# Patient Record
Sex: Female | Born: 1945
Health system: Southern US, Community
[De-identification: ages and names within clinical notes are randomized; demographics above are authoritative.]

## PROBLEM LIST (undated history)

## (undated) DIAGNOSIS — E785 Hyperlipidemia, unspecified: Secondary | ICD-10-CM

## (undated) DIAGNOSIS — K219 Gastro-esophageal reflux disease without esophagitis: Secondary | ICD-10-CM

## (undated) DIAGNOSIS — N189 Chronic kidney disease, unspecified: Secondary | ICD-10-CM

## (undated) DIAGNOSIS — I10 Essential (primary) hypertension: Secondary | ICD-10-CM

## (undated) DIAGNOSIS — M1990 Unspecified osteoarthritis, unspecified site: Secondary | ICD-10-CM

## (undated) DIAGNOSIS — H269 Unspecified cataract: Secondary | ICD-10-CM

## (undated) DIAGNOSIS — Z5189 Encounter for other specified aftercare: Secondary | ICD-10-CM

## (undated) DIAGNOSIS — E119 Type 2 diabetes mellitus without complications: Secondary | ICD-10-CM

## (undated) HISTORY — DX: Gastro-esophageal reflux disease without esophagitis: K21.9

## (undated) HISTORY — PX: COLONOSCOPY: SHX174

## (undated) HISTORY — PX: APPENDECTOMY: SHX54

## (undated) HISTORY — DX: Type 2 diabetes mellitus without complications: E11.9

## (undated) HISTORY — DX: Unspecified cataract: H26.9

## (undated) HISTORY — PX: EYE SURGERY: SHX253

## (undated) HISTORY — PX: SPINE SURGERY: SHX786

## (undated) HISTORY — DX: Chronic kidney disease, unspecified: N18.9

## (undated) HISTORY — DX: Encounter for other specified aftercare: Z51.89

## (undated) HISTORY — DX: Hyperlipidemia, unspecified: E78.5

## (undated) HISTORY — PX: TONSILLECTOMY: SUR1361

---

## 1972-10-04 HISTORY — PX: ABDOMINAL HYSTERECTOMY: SHX81

## 2011-11-22 DIAGNOSIS — H01009 Unspecified blepharitis unspecified eye, unspecified eyelid: Secondary | ICD-10-CM | POA: Diagnosis not present

## 2012-07-20 ENCOUNTER — Ambulatory Visit (INDEPENDENT_AMBULATORY_CARE_PROVIDER_SITE_OTHER): Payer: Managed Care, Other (non HMO) | Admitting: Family Medicine

## 2012-07-20 VITALS — BP 179/111 | HR 90 | Temp 98.0°F | Resp 16 | Ht 63.0 in | Wt 207.2 lb

## 2012-07-20 DIAGNOSIS — R21 Rash and other nonspecific skin eruption: Secondary | ICD-10-CM

## 2012-07-20 DIAGNOSIS — L299 Pruritus, unspecified: Secondary | ICD-10-CM

## 2012-07-20 MED ORDER — METHYLPREDNISOLONE 4 MG PO KIT: PACK | ORAL | Status: DC

## 2012-07-20 MED ORDER — METHYLPREDNISOLONE SODIUM SUCC 125 MG IJ SOLR
125.0000 mg | Freq: Once | INTRAMUSCULAR | Status: AC
  Administered 2012-07-20: 125 mg via INTRAMUSCULAR

## 2012-07-20 NOTE — Patient Instructions (Signed)
Rash  A rash is a change in the color or texture of your skin. There are many different types of rashes. You may have other problems that accompany your rash.  CAUSES     Infections.   Allergic reactions. This can include allergies to pets or foods.   Certain medicines.   Exposure to certain chemicals, soaps, or cosmetics.   Heat.   Exposure to poisonous plants.   Tumors, both cancerous and noncancerous.  SYMPTOMS     Redness.   Scaly skin.   Itchy skin.   Dry or cracked skin.   Bumps.   Blisters.   Pain.  DIAGNOSIS   Your caregiver may do a physical exam to determine what type of rash you have. A skin sample (biopsy) may be taken and examined under a microscope.  TREATMENT    Treatment depends on the type of rash you have. Your caregiver may prescribe certain medicines. For serious conditions, you may need to see a skin doctor (dermatologist).  HOME CARE INSTRUCTIONS     Avoid the substance that caused your rash.   Do not scratch your rash. This can cause infection.   You may take cool baths to help stop itching.   Only take over-the-counter or prescription medicines as directed by your caregiver.   Keep all follow-up appointments as directed by your caregiver.  SEEK IMMEDIATE MEDICAL CARE IF:   You have increasing pain, swelling, or redness.   You have a fever.   You have new or severe symptoms.   You have body aches, diarrhea, or vomiting.   Your rash is not better after 3 days.  MAKE SURE YOU:   Understand these instructions.   Will watch your condition.   Will get help right away if you are not doing well or get worse.  Document Released: 09/10/2002 Document Revised: 12/13/2011 Document Reviewed: 07/05/2011  [employer] Patient Information 2013 [employer].

## 2012-07-20 NOTE — Progress Notes (Signed)
[facility]:  Office Visit  Chief Complaint:  Chief Complaint  Patient presents with  . Rash    around neck right inner thigh under right breast started last night    HPI: Erica Thomas is a 66 y.o. female who complains of  Rash on bilateral part of body from head, neck, and breast, was traveling and in different hotels. Itching and pain but numbness or tingling. More itching. No new meds, no new soaps, no new detergents. She is moving from [place] to [place]. She has been moving boxes in the sheds. Moved from [place] to [place].  Past Medical History  Diagnosis Date  . Hyperlipidemia   . Cataract   . Diabetes mellitus without complication    Past Surgical History  Procedure Date  . Appendectomy   . Abdominal hysterectomy    History   Social History  . Marital Status: Divorced    Spouse Name: N/A    Number of Children: N/A  . Years of Education: N/A   Social History Main Topics  . Smoking status: Never Smoker   . Smokeless tobacco: None  . Alcohol Use: No  . Drug Use: No  . Sexually Active: None   Other Topics Concern  . None   Social History Narrative  . None   Family History  Problem Relation Age of Onset  . Stroke Mother   . Heart disease Father   . Cancer Sister    Allergies  Allergen Reactions  . Penicillins    Prior to Admission medications   Not on File     ROS: The patient denies fevers, chills, night sweats, unintentional weight loss, chest pain, palpitations, wheezing, dyspnea on exertion, nausea, vomiting, abdominal pain, dysuria, hematuria, melena, numbness, weakness, or tingling.   All other systems have been reviewed and were otherwise negative with the exception of those mentioned in the HPI and as above.    PHYSICAL EXAM: Filed Vitals:   07/20/12 0922  BP: 179/111  Pulse: 90  Temp: 98 F (36.7 C)  Resp: 16   Filed Vitals:   07/20/12 0922  Height: 5\' 3"  (1.6 m)  Weight: 207 lb 3.2 oz (93.985 kg)    Body mass index is 36.70 kg/(m^2).  General: Alert, no acute distress HEENT:  Normocephalic, atraumatic, oropharynx patent.  Cardiovascular:  Regular rate and rhythm, no rubs murmurs or gallops.  No Carotid bruits, radial pulse intact. No pedal edema.  Respiratory: Clear to auscultation bilaterally.  No wheezes, rales, or rhonchi.  No cyanosis, no use of accessory musculature GI: No organomegaly, abdomen is soft and non-tender, positive bowel sounds.  No masses. Skin: [place] erythematous lesions Neurologic: Facial musculature symmetric. Psychiatric: Patient is appropriate throughout our interaction. Lymphatic: No cervical lymphadenopathy Musculoskeletal: Gait intact.   LABS: No results found for this or any previous visit.   EKG/XRAY:   Primary read interpreted by Dr. Conley Rolls at [facility].   ASSESSMENT/PLAN: Encounter Diagnoses  Name Primary?  . Rash Yes  . Pruritus    Most likely related to spider/insect bite and this appears to be less likely shingles since it is on both sides of her body and mostly on head and neck.  Solumedrol IM x 1, Medrol dose pack, Take Benadryl and Zyrtec  I don't think she needs abx at this time since it looks like an inflammatory allergic dermatitis picture but she may need it if it becomes worse.     LE, THAO PHUONG, DO 07/20/2012 9:41 AM

## 2012-09-21 ENCOUNTER — Encounter: Payer: Self-pay | Admitting: Family Medicine

## 2012-09-21 ENCOUNTER — Ambulatory Visit (INDEPENDENT_AMBULATORY_CARE_PROVIDER_SITE_OTHER): Payer: Managed Care, Other (non HMO) | Admitting: Family Medicine

## 2012-09-21 VITALS — BP 192/110 | HR 94 | Temp 98.7°F | Resp 16 | Ht 63.0 in | Wt 207.4 lb

## 2012-09-21 DIAGNOSIS — M79609 Pain in unspecified limb: Secondary | ICD-10-CM

## 2012-09-21 DIAGNOSIS — E119 Type 2 diabetes mellitus without complications: Secondary | ICD-10-CM | POA: Diagnosis not present

## 2012-09-21 DIAGNOSIS — E785 Hyperlipidemia, unspecified: Secondary | ICD-10-CM | POA: Diagnosis not present

## 2012-09-21 DIAGNOSIS — Z23 Encounter for immunization: Secondary | ICD-10-CM

## 2012-09-21 DIAGNOSIS — I10 Essential (primary) hypertension: Secondary | ICD-10-CM | POA: Insufficient documentation

## 2012-09-21 LAB — COMPREHENSIVE METABOLIC PANEL
AST: 22 U/L (ref 0–37)
Albumin: 4.4 g/dL (ref 3.5–5.2)
Alkaline Phosphatase: 115 U/L (ref 39–117)
Calcium: 9.5 mg/dL (ref 8.4–10.5)
Chloride: 102 mEq/L (ref 96–112)
Potassium: 4 mEq/L (ref 3.5–5.3)
Sodium: 140 mEq/L (ref 135–145)
Total Protein: 6.7 g/dL (ref 6.0–8.3)

## 2012-09-21 LAB — LIPID PANEL: LDL Cholesterol: 218 mg/dL — ABNORMAL HIGH (ref 0–99)

## 2012-09-21 MED ORDER — ATORVASTATIN CALCIUM 40 MG PO TABS: 40.0000 mg | ORAL_TABLET | Freq: Every day | ORAL | Status: DC

## 2012-09-21 MED ORDER — METFORMIN HCL 500 MG PO TABS: 500.0000 mg | ORAL_TABLET | Freq: Two times a day (BID) | ORAL | Status: DC

## 2012-09-21 MED ORDER — TRAMADOL HCL 50 MG PO TABS: 50.0000 mg | ORAL_TABLET | Freq: Three times a day (TID) | ORAL | Status: DC | PRN

## 2012-09-21 MED ORDER — LISINOPRIL-HYDROCHLOROTHIAZIDE 20-25 MG PO TABS: 1.0000 | ORAL_TABLET | Freq: Every day | ORAL | Status: DC

## 2012-09-21 MED ORDER — SITAGLIPTIN PHOS-METFORMIN HCL 50-500 MG PO TABS: 1.0000 | ORAL_TABLET | Freq: Two times a day (BID) | ORAL | Status: DC

## 2012-09-21 NOTE — Progress Notes (Signed)
S:  This 66 y.o. Cauc female moved to [place] from [place] when her job transferred her. She has HTN-Hyperlipidemia-Obesity syndrome but has been off medications for a year. She was being treated for Diabetes; last A1c= 6.4%. The pt was taking Janumet because she could not tolerate Metformin (diarrhea). Without medications, she has tried to manage nutrition by but has a weakness for bread. She is unable to exercise because of chronic left thigh pain.  Pt reports left hip and knee pain for 1 year. She fell and has had pain since that time; hip and femur xrays post-injury were negative. She has pain in left anterior thigh when she flexs her hip and when she lays on left hip at night. She can walk short distances but pain develops after prolonged ambulation.  Aleve relieves pain.  ROS: As per HPI; negative for  Unexplained weight gain or loss, fever/ chills, temperature intolerance, CP or tightness, SOB or DOE, palpitations, edema, excessive thirst or polyuria, HA, dizziness, weakness, numbness or syncope.  O:  Filed Vitals:   09/21/12 1611 09/21/12 1654  BP: 184/108 192/110  Pulse: 94   Temp: 98.7 F (37.1 C)   TempSrc: Oral   Resp: 16   Height: 5\' 3"  (1.6 m)   Weight: 207 lb 6.4 oz (94.076 kg)   SpO2: 97%    GEN: In NAD; WN,WD. HEENT: /AT; EOMI w/ clear conj/ sclerae. EACs/TMs normal. Oroph moist and clear. NECK: Suppple w/o LAN or TM. COR: RRR. LUNGS: Normal resp rate and effort. MS: Spine- straight and NT. SI joints- NT. R hip- FROM. L hip- limited abduction; quadriceps muscle tender to palpation. NEURO: A&O x 3; CNs intact. Motor- L hip flexor 3+/5; L knee extension against resistance- 3+/5. R lower ext- 4+/5.                DTRs- 2+/= in UEs nad LEs. Sensory intact.   Results for orders placed in visit on 09/21/12  POCT GLYCOSYLATED HEMOGLOBIN (HGB A1C)      Component Value Range   Hemoglobin A1C 6.2      A/P: 1. Type II or unspecified type diabetes mellitus without  mention of complication, uncontrolled  Will not resume Medication (pt has side effect w/ Metformin) Pt to manage nutrition better and try to be more active.   2. Hyperlipidemia  RX: Atorvastatin 40 mg 1 tablet at bedtime Get information from [facility] web-site re: Lipid Diet Comprehensive metabolic panel, Lipid panel  3. Limb pain  Vitamin D, 25-hydroxy MR Femur Left Wo Contrast RX: Tramadol 50 mg 1 tab at bedtime; continue Aleve 1 tab bid prn pain.  4. Need for prophylactic vaccination and inoculation against influenza  Flu vaccine greater than or equal to 3yo preservative free IM   5.  HTN                                                                     RX: Lisinopril- HCTZ 20-25 mg 1 tablet daily  Get ECG at next visit.

## 2012-09-21 NOTE — Patient Instructions (Addendum)
You will work on nutrition and trying to be more active until we can figure out what is wrong with your thigh. [facility] has a Lipid Diet taht you may be able to find on the [facility] web-site. Also, look for information about Mediterannean Diet. BP medication- Lisinopril-HCTZ  20-25 mg  1 tablet daily to lower blood pressure. Tramadol is for pain; you can take 1 tablet every 8 hours or just take it at bedtime to help you rest better.  You will be called about appointment for MRI of your left femur (muscle).  You will be returning for recheck in 4 weeks.

## 2012-09-22 ENCOUNTER — Encounter: Payer: Self-pay | Admitting: Family Medicine

## 2012-09-22 ENCOUNTER — Other Ambulatory Visit: Payer: Self-pay | Admitting: Family Medicine

## 2012-09-22 LAB — VITAMIN D 25 HYDROXY (VIT D DEFICIENCY, FRACTURES): Vit D, 25-Hydroxy: 16 ng/mL — ABNORMAL LOW (ref 30–89)

## 2012-09-22 MED ORDER — ERGOCALCIFEROL 1.25 MG (50000 UT) PO CAPS: 50000.0000 [IU] | ORAL_CAPSULE | ORAL | Status: DC

## 2012-09-28 ENCOUNTER — Ambulatory Visit
Admission: RE | Admit: 2012-09-28 | Discharge: 2012-09-28 | Disposition: A | Discharge status: Home / Self Care | Payer: 59 | Source: Ambulatory Visit | Attending: Family Medicine | Admitting: Family Medicine

## 2012-09-28 DIAGNOSIS — M79609 Pain in unspecified limb: Secondary | ICD-10-CM

## 2012-09-28 NOTE — Progress Notes (Signed)
Quick Note:  Your recent MRI did not show any abnormality in the thigh bone or muscle. You have an appt on Oct 12, 2012 for follow-up; we can discuss the results in detail at that time. ______

## 2012-10-12 ENCOUNTER — Encounter: Payer: Self-pay | Admitting: Family Medicine

## 2012-10-12 ENCOUNTER — Ambulatory Visit (INDEPENDENT_AMBULATORY_CARE_PROVIDER_SITE_OTHER): Payer: Managed Care, Other (non HMO) | Admitting: Family Medicine

## 2012-10-12 VITALS — BP 158/90 | HR 80 | Temp 98.4°F | Resp 16 | Ht 61.5 in | Wt 202.0 lb

## 2012-10-12 DIAGNOSIS — M5430 Sciatica, unspecified side: Secondary | ICD-10-CM | POA: Diagnosis not present

## 2012-10-12 DIAGNOSIS — E119 Type 2 diabetes mellitus without complications: Secondary | ICD-10-CM

## 2012-10-12 DIAGNOSIS — M5432 Sciatica, left side: Secondary | ICD-10-CM

## 2012-10-12 DIAGNOSIS — I10 Essential (primary) hypertension: Secondary | ICD-10-CM

## 2012-10-12 MED ORDER — PREDNISONE 20 MG PO TABS: 20.0000 mg | ORAL_TABLET | Freq: Every day | ORAL | Status: DC

## 2012-10-12 NOTE — Patient Instructions (Addendum)
Health Maintenance Plans     COLONOSCOPY EVERY 10 YEARS    INFLUENZA VACCINE AGE 69 MONTHS AND UP .  Every year   MAMMOGRAM EVERY 2 YEARS    PNEUMOCOCCAL POLYSACCHARIDE VACCINE AGE 675 AND OVER    TETANUS/TDAP AGE 47 +  Every 10 years   ZOSTAVAX AGE 670 AND OVER.  One time Sciatica Sciatica is pain, weakness, numbness, or tingling along the path of the sciatic nerve. The nerve starts in the lower back and runs down the back of each leg. The nerve controls the muscles in the lower leg and in the back of the knee, while also providing sensation to the back of the thigh, lower leg, and the sole of your foot. Sciatica is a symptom of another medical condition. For instance, nerve damage or certain conditions, such as a herniated disk or bone spur on the spine, pinch or put pressure on the sciatic nerve. This causes the pain, weakness, or other sensations normally associated with sciatica. Generally, sciatica only affects one side of the body. CAUSES   Herniated or slipped disc.  Degenerative disk disease.  A pain disorder involving the narrow muscle in the buttocks (piriformis syndrome).  Pelvic injury or fracture.  Pregnancy.  Tumor (rare). SYMPTOMS  Symptoms can vary from mild to very severe. The symptoms usually travel from the low back to the buttocks and down the back of the leg. Symptoms can include:  Mild tingling or dull aches in the lower back, leg, or hip.  Numbness in the back of the calf or sole of the foot.  Burning sensations in the lower back, leg, or hip.  Sharp pains in the lower back, leg, or hip.  Leg weakness.  Severe back pain inhibiting movement. These symptoms may get worse with coughing, sneezing, laughing, or prolonged sitting or standing. Also, being overweight may worsen symptoms. DIAGNOSIS  Your caregiver will perform a physical exam to look for common symptoms of sciatica. He or she may ask you to do certain movements or activities that would trigger  sciatic nerve pain. Other tests may be performed to find the cause of the sciatica. These may include:  Blood tests.  X-rays.  Imaging tests, such as an MRI or CT scan. TREATMENT  Treatment is directed at the cause of the sciatic pain. Sometimes, treatment is not necessary and the pain and discomfort goes away on its own. If treatment is needed, your caregiver may suggest:  Over-the-counter medicines to relieve pain.  Prescription medicines, such as anti-inflammatory medicine, muscle relaxants, or narcotics.  Applying heat or ice to the painful area.  Steroid injections to lessen pain, irritation, and inflammation around the nerve.  Reducing activity during periods of pain.  Exercising and stretching to strengthen your abdomen and improve flexibility of your spine. Your caregiver may suggest losing weight if the extra weight makes the back pain worse.  Physical therapy.  Surgery to eliminate what is pressing or pinching the nerve, such as a bone spur or part of a herniated disk. HOME CARE INSTRUCTIONS   Only take over-the-counter or prescription medicines for pain or discomfort as directed by your caregiver.  Apply ice to the affected area for 20 minutes, 3 4 times a day for the first 48 72 hours. Then try heat in the same way.  Exercise, stretch, or perform your usual activities if these do not aggravate your pain.  Attend physical therapy sessions as directed by your caregiver.  Keep all follow-up appointments as directed  by your caregiver.  Do not wear high heels or shoes that do not provide proper support.  Check your mattress to see if it is too soft. A firm mattress may lessen your pain and discomfort. SEEK IMMEDIATE MEDICAL CARE IF:   You lose control of your bowel or bladder (incontinence).  You have increasing weakness in the lower back, pelvis, buttocks, or legs.  You have redness or swelling of your back.  You have a burning sensation when you  urinate.  You have pain that gets worse when you lie down or awakens you at night.  Your pain is worse than you have experienced in the past.  Your pain is lasting longer than 4 weeks.  You are suddenly losing weight without reason. MAKE SURE YOU:  Understand these instructions.  Will watch your condition.  Will get help right away if you are not doing well or get worse. Document Released: 09/14/2001 Document Revised: 03/21/2012 Document Reviewed: 01/30/2012 [facility] Patient Information 2013 [place], [place].

## 2012-10-12 NOTE — Progress Notes (Signed)
67 yo woman who has retired and has had left thigh and hip pain for over a year, partially relieved by aleve and celebrex in the past.  The pain is very noticeable when she swings her leg up into the bed.  She does much of her own cooking and would like to walk more, but the prolonged standing and walking of these activities is interfering. She recalls two falls in the last year that may have started the back and left leg discomfort. No GI or GU symptoms, no fever  Objective:  NAD, alert and very pleasant Straight leg raising is positive on the left Internal and external rotation of the hip is normal Palpation of the left trochanteric region shows some tenderness, but she is nontender in her lumbar spine Knee crossover does not suggest sacroiliac inflammation Good pedal pulses  Assessment: improved blood pressure, the back pain seems most consistent with a sciatica L5 type syndrome (especially since prednisone given last December had a noticeable improvement effect)  Plan: Follow up one month 1. Sciatica of left side  predniSONE (DELTASONE) 20 MG tablet  2. Type 2 diabetes mellitus    3. Hypertension    Note:  I did not to an EKG because patient has had an echo and thorough workup in the past We discussed diet and exercise as well.  I don't think walking is a good idea, but rather some more gentle exercise such as aquatic activity would be easier on the joints and back. We reviewed health maintenance issues this visit as well.

## 2012-11-09 ENCOUNTER — Encounter: Payer: Self-pay | Admitting: Family Medicine

## 2012-11-09 ENCOUNTER — Ambulatory Visit (INDEPENDENT_AMBULATORY_CARE_PROVIDER_SITE_OTHER): Payer: Managed Care, Other (non HMO) | Admitting: Family Medicine

## 2012-11-09 VITALS — BP 136/77 | HR 92 | Temp 98.9°F | Resp 16 | Ht 62.25 in | Wt 204.0 lb

## 2012-11-09 DIAGNOSIS — M5137 Other intervertebral disc degeneration, lumbosacral region: Secondary | ICD-10-CM

## 2012-11-09 DIAGNOSIS — M79609 Pain in unspecified limb: Secondary | ICD-10-CM | POA: Diagnosis not present

## 2012-11-09 DIAGNOSIS — M79606 Pain in leg, unspecified: Secondary | ICD-10-CM

## 2012-11-09 DIAGNOSIS — Z760 Encounter for issue of repeat prescription: Secondary | ICD-10-CM | POA: Diagnosis not present

## 2012-11-09 MED ORDER — ATORVASTATIN CALCIUM 40 MG PO TABS: 40.0000 mg | ORAL_TABLET | Freq: Every day | ORAL | Status: DC

## 2012-11-09 MED ORDER — LISINOPRIL-HYDROCHLOROTHIAZIDE 20-25 MG PO TABS: 1.0000 | ORAL_TABLET | Freq: Every day | ORAL | Status: DC

## 2012-11-12 NOTE — Progress Notes (Signed)
S:  This 67 y.o. Cauc female has Type II DM (last A1c= 6.2% in Dec 2013), HTN- well controlled and lipid disorder. I saw pt in mid-Dec 2013 for left leg pain and scheduled an MRI; result did not show any significant problem in the hip or thigh. She was seen at [facility] on 10/12/2012 for evaluation of left leg pain; diagnosed w/ sciatica. She has brief improvement after taking Prednisone. Pt reports recurrence of left leg and thigh discomfort. She has pain radiating from hip area down to anterior and lateral thigh and is having occasional weakness in leg (worse w/ prolonged standing).  She has minimal c/o LBP or saddle anesthesia; there is no bowel or bladder incontinence.  ROS: As per HPI.  O:  Filed Vitals:   11/09/12 1551  BP: 136/77  Pulse: 92  Temp: 98.9 F (37.2 C)  Resp: 16   GEN: In NAD: WN,WD. HENT: [place]/AT; EOMI w/ clear  Conj/ sclerae. Otherwise benign. NECK: Supple and nontender over posterior C-spine processes. COR: RRR. LUNGS: Normal resp rate and effort. BACK: LS spine- nontender w/ palpation of vertebral bodies or SI joints.  MS: ROM in hips is normal. NEURO: A&O x 3; CNs intact. Gait is normal. Coordination is normal. Pt unable to walk on toes or heels.  MRI of Left  Hip shows anterolisthesis of LS junction which may explain pt's symptoms.  A?P:  1. DDD (degenerative disc disease), lumbosacral  Ambulatory referral to Physical Therapy If not improvement, consider ORTHO referral.   Weight reduction could be helpful.  2. Chronic leg pain  Suspect radiculopathy related to LS spine DDD  3. Medication refill    4.   HTN- stable; refill medication.    Meds ordered this encounter  Medications  . lisinopril-hydrochlorothiazide (PRINZIDE,ZESTORETIC) 20-25 MG per tablet    Sig: Take 1 tablet by mouth daily.    Dispense:  30 tablet    Refill:  3  . atorvastatin (LIPITOR) 40 MG tablet    Sig: Take 1 tablet (40 mg total) by mouth daily.    Dispense:  30 tablet    Refill:  3

## 2012-11-14 ENCOUNTER — Encounter: Payer: Self-pay | Admitting: Family Medicine

## 2012-12-13 ENCOUNTER — Telehealth: Payer: Self-pay

## 2012-12-13 NOTE — Telephone Encounter (Signed)
Dr. Laurier Nancy w/ [facility] would like to speak with you regarding the patients PT  Please call  [phone]

## 2013-02-08 ENCOUNTER — Ambulatory Visit (INDEPENDENT_AMBULATORY_CARE_PROVIDER_SITE_OTHER): Payer: Managed Care, Other (non HMO) | Admitting: Family Medicine

## 2013-02-08 ENCOUNTER — Ambulatory Visit: Payer: Self-pay

## 2013-02-08 ENCOUNTER — Encounter: Payer: Self-pay | Admitting: Family Medicine

## 2013-02-08 VITALS — BP 140/72 | HR 80 | Temp 99.5°F | Resp 16 | Ht 62.0 in | Wt 195.8 lb

## 2013-02-08 DIAGNOSIS — M5137 Other intervertebral disc degeneration, lumbosacral region: Secondary | ICD-10-CM

## 2013-02-08 DIAGNOSIS — E7800 Pure hypercholesterolemia, unspecified: Secondary | ICD-10-CM

## 2013-02-08 DIAGNOSIS — E559 Vitamin D deficiency, unspecified: Secondary | ICD-10-CM

## 2013-02-08 DIAGNOSIS — I10 Essential (primary) hypertension: Secondary | ICD-10-CM | POA: Diagnosis not present

## 2013-02-08 DIAGNOSIS — M5136 Other intervertebral disc degeneration, lumbar region: Secondary | ICD-10-CM

## 2013-02-08 NOTE — Patient Instructions (Signed)

## 2013-02-08 NOTE — Progress Notes (Signed)
S:  This 67 y.o. Cauc female is here for re-evaluation of LBP. Pt successfully completed PT for chronic L hip and leg pain. She has some pain reduction but now has change in urine stream and defecation problems. She can not arise from a chair w/o pain in low back and L hip. She has severe pain after grocery shopping for 20-30 minutes. Paresthesias are present in L lower leg.  Pt has Vit D deficiency and HTN (controlled) and needs labs drawn today (she has fasted). Medication compliance is excellent; pt has no complaints of side effects. Pt is taking Vitamin D3 2000 IU daily; she notes some improvement of mental clarity. She does not report fatigue, diaphoresis, CP or tightness, palpitations, SOB or DOE, cough, abd pain, HA, dizziness, weakness or syncope. Sleep hygiene is good.   Patient Active Problem List   Diagnosis Date Noted  . HTN, goal below 140/80 09/21/2012  . Limb pain 09/21/2012  . Hyperlipidemia 09/21/2012  . Type II or unspecified type diabetes mellitus without mention of complication, not stated as uncontrolled 09/21/2012    PMHx, Soc Hx and Fam Hx reviewed.  ROS: As per HPI.  O:  Filed Vitals:   02/08/13 1549  BP: 140/72  Pulse: 80  Temp: 99.5 F (37.5 C)  Resp: 16   GEN: In NAD; WN,WD. HENT: [place]/AT; EOMI w/ clear conj/ sclerae. EACs/ oroph clear and moist. NECK: Supple. COR: RRR. LUNGS: Normal resp rate and effort. BACK: Spine is straight; nontender over LS spine or SI joints. SLR + at 60 degrees on L; abduction >> + for pain. NEURO: A&O x 3; CNs intact. DTRs 2+ on R patella, 1+ on L patella. Pt can walk on toes and heels and can bear weight on each leg separately. No gait abnormality.   [facility] reading (PRIMARY) by  Dr. Audria Nine: LS spine- Lumbar degenerative changes with L5-S1 anterolisthesis.   A/P: DDD (degenerative disc disease), lumbar -  Continue to modify activities and take OTC analgesic as needed.  Plan: Ambulatory referral to Orthopedic Surgery, MR  Lumbar Spine Wo Contrast  HTN, goal below 140/90 - Plan: Basic metabolic panel, TSH, T4, Free  Unspecified vitamin D deficiency - Plan: Vitamin D, 25-hydroxy  Pure hypercholesterolemia - Plan: Lipid panel, TSH

## 2013-02-09 ENCOUNTER — Encounter: Payer: Self-pay | Admitting: Family Medicine

## 2013-02-09 LAB — BASIC METABOLIC PANEL
Glucose, Bld: 93 mg/dL (ref 70–99)
Potassium: 4 mEq/L (ref 3.5–5.3)
Sodium: 137 mEq/L (ref 135–145)

## 2013-02-09 LAB — LIPID PANEL
LDL Cholesterol: 73 mg/dL (ref 0–99)
Total CHOL/HDL Ratio: 2.8 Ratio
VLDL: 21 mg/dL (ref 0–40)

## 2013-02-09 LAB — T4, FREE: Free T4: 1.19 ng/dL (ref 0.80–1.80)

## 2013-02-09 LAB — TSH: TSH: 1.081 u[IU]/mL (ref 0.350–4.500)

## 2013-02-09 LAB — VITAMIN D 25 HYDROXY (VIT D DEFICIENCY, FRACTURES): Vit D, 25-Hydroxy: 44 ng/mL (ref 30–89)

## 2013-02-16 ENCOUNTER — Ambulatory Visit
Admission: RE | Admit: 2013-02-16 | Discharge: 2013-02-16 | Disposition: A | Discharge status: Home / Self Care | Payer: 59 | Source: Ambulatory Visit | Attending: Family Medicine | Admitting: Family Medicine

## 2013-02-16 ENCOUNTER — Telehealth: Payer: Self-pay | Admitting: Family Medicine

## 2013-02-16 DIAGNOSIS — M5136 Other intervertebral disc degeneration, lumbar region: Secondary | ICD-10-CM

## 2013-02-16 NOTE — Telephone Encounter (Signed)
I spoke w/ pt about MRI result. She has appt w/ ORTHO next week and has copy of MRI on disk. Will come by [facility] next week to pick-up disk of plain films of LS spine.

## 2013-02-21 ENCOUNTER — Telehealth: Payer: Self-pay

## 2013-02-21 NOTE — Telephone Encounter (Signed)
error 

## 2013-02-21 NOTE — Telephone Encounter (Signed)
Pt is calling because she needs copy of disk of lumbar spine  Call back number is [phone]

## 2013-02-22 NOTE — Telephone Encounter (Signed)
Request given to xray 

## 2013-02-23 DIAGNOSIS — M4310 Spondylolisthesis, site unspecified: Secondary | ICD-10-CM | POA: Diagnosis not present

## 2013-02-27 DIAGNOSIS — M4310 Spondylolisthesis, site unspecified: Secondary | ICD-10-CM | POA: Diagnosis not present

## 2013-03-08 ENCOUNTER — Other Ambulatory Visit: Payer: Self-pay | Admitting: Family Medicine

## 2013-03-19 NOTE — H&P (Signed)
History of Present Illness  The patient is a 67 year old female who presents with back pain. The patient is here today in referral from Dr. Ethelene Hal. The patient reports low back symptoms including low back pain which began 3 year(s) ago following a specific injury. The injury occurred due to a fall (3 years ago and again 8 months ago). The pain radiates to the left lower leg. The patient describes the pain as sharp. The patient feels as if the symptoms are worsening. Symptoms are exacerbated by standing. Symptoms are relieved by activity modification. Prior to being seen today the patient was previously evaluated by a colleague. Past evaluation has included x-ray of the lumbar spine and MRI of the lumbar spine. Past treatment has included physical therapy.    Subjective Transcription  PRIMARY CARE PHYSICIAN:  Dow Adolph, MD    REASON FOR CONSULTATION:  Longstanding back, buttock and left leg pain.    Erica Thomas is a very pleasant young woman who has always had issues with her back and leg that have affected her quality of life. Over the years she has had physical therapy. She has modified her activities. She has taken various types of medication. Her overall quality of life continues to diminish. She can not walk long distances. She has difficulty arising from a seated position. 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 She rarely goes out due to the pain.  As a result, she ultimately saw my partner, Caralyn Guile. Ramos, MD, who evaluated her and did not feel as though injection therapy would be of any benefit and she therefore was sent to me.  The MRI performed on 02-16-13 demonstrates a Grade 2 degenerative slip at L5-S1 with significant foraminal and lateral recess stenosis. There is also a small right adrenal nodule , questionable adenoma. No other significant pathology is noted.   Allergies Penicillin V Potassium  *PENICILLINS*   Family History Cancer. sister and grandfather mothers side Cerebrovascular Accident. mother Congestive Heart Failure. mother Diabetes Mellitus. mother, father and grandmother mothers side Heart Disease. mother, father and grandmother mothers side Heart disease in female family member before age 14 Heart disease in female family member before age 16 Hypertension. mother and grandmother mothers side Rheumatoid Arthritis. father   Social History Alcohol use. Never consumed alcohol. current drinker; drinks hard liquor; only occasionally per week Children. 1 Current work status. working full time Drug/Alcohol Rehab (Currently). no Drug/Alcohol Rehab (Previously). no Exercise. Exercises rarely; does running / walking Illicit drug use. no Living situation. live alone Marital status. divorced Number of flights of stairs before winded. 1 Pain Contract. no Tobacco use. Never smoker. never smoker   Medication History Atorvastatin Calcium (40MG  Tablet, 1 Oral daily) Active. TraMADol HCl (50MG  Tablet, 1 Oral every eight hours, as needed) Active. Vitamin D (Ergocalciferol) (50000UNIT Capsule, 1 Oral weekly) Active. Lisinopril-Hydrochlorothiazide (20-25MG  Tablet, 1 Oral daily) Active.   Past Surgical History Appendectomy Hysterectomy. partial (non-cancerous) Tonsillectomy   Other Problems Diabetes Mellitus, Type II High blood pressure Hypercholesterolemia Oophorectomy. left   Objective Transcription  She is a pleasant woman who appears younger than her stated age. She is alert and oriented times three. She has significant pain with deep palpation of the lumbar spine. She does have some improvement, less pain with forward flexion as compared to extension. She has a negative straight leg raise test. No dysesthesias in the lower extremity. EHL,tibialis anterior and gastrocnemius are intact bilaterally. Compartments are  soft and  nontender. Intact peripheral pulses. She has pain when she ambulates for any great distance. This is less when she assumes a forward flexed position. No obvious skin lesions, abrasions or contusions. No incontinence of bowel and bladder.    Assessments Transcription  At this point in time, the patient's clinical symptoms and images confirm degenerative Grade 2 spondylolisthesis at L5-S1.   Plans Transcription  At this point, as injection therapy is not advisable and her quality of life is becoming significantly compromised, I think proceeding with a decompression and instrumented fusion is worthwhile. I think the main problem is the neurogenic claudication symptoms she is having, left side greater than right. This can be addressed with a Gill decompression at L5-S1. Because of the underlying instability pattern that exist, once I decompressed her, because of increased risk of worsening spondylolisthesis, I would recommend an instrumented fusion to prevent further instability and address her back pain that is stemming from the instability (sagittal imbalance). I have explained to her the procedure. I have given her some literature to review. She has expressed the desire to proceed. The risks include infection, bleeding, nerve damage, death , stroke, paralysis, failure to heal, need for further surgery, ongoing or worse pain, loss of bowel and bladder control, leak of spinal fluid, blood clots, injury to the bowel and bladder, ongoing and worse pain. All questions were encouraged and answered. We will plan on proceeding with surgery once we have preoperative medical clearance.

## 2013-03-20 ENCOUNTER — Encounter (HOSPITAL_COMMUNITY): Payer: Self-pay | Admitting: Pharmacy Technician

## 2013-03-20 ENCOUNTER — Ambulatory Visit (INDEPENDENT_AMBULATORY_CARE_PROVIDER_SITE_OTHER): Payer: BC Managed Care – PPO | Admitting: Family Medicine

## 2013-03-20 ENCOUNTER — Encounter: Payer: Self-pay | Admitting: Family Medicine

## 2013-03-20 VITALS — BP 134/74 | HR 100 | Temp 98.9°F | Resp 16 | Ht 61.5 in | Wt 197.4 lb

## 2013-03-20 DIAGNOSIS — I10 Essential (primary) hypertension: Secondary | ICD-10-CM

## 2013-03-20 DIAGNOSIS — Z01810 Encounter for preprocedural cardiovascular examination: Secondary | ICD-10-CM | POA: Diagnosis not present

## 2013-03-20 NOTE — Progress Notes (Signed)
S:  This 67 y.o. Cauc female has Lumbar DDD and has been scheduled for surgery on 03/28/2013. She has persistent leg pain not responding to conservative treatment. She has HTN and impaired glucose tolerance/Type II DM, controlled with dietary measures. Pt has managed to reduce weight by 10 pounds ([employer] online) despite inactivity due to chronic back and leg pain. Pt denies any adverse effects with medication; ROS negative for diaphoresis, fatigue, abnormal weight change, vision changes, CP or tightness, palpitations, edema, abdominal pain, polyuria/ polydipsia, HA, dizziness,weakness, numbness or syncope. Sleep disturbance has been minimal associated with DDD.  Pt has been advised that hospitalization will be followed by Skilled Nursing Rehab because she lives alone; her son lives in [place] and travels for his job. The pt does not want to impose on him.  Patient Active Problem List   Diagnosis Date Noted  . DDD (degenerative disc disease), lumbar 02/08/2013  . HTN, goal below 140/80 09/21/2012  . Limb pain 09/21/2012  . Hyperlipidemia 09/21/2012  . Type II or unspecified type diabetes mellitus without mention of complication, not stated as uncontrolled 09/21/2012   PMHx, Soc Hx and Fam Hx reviewed.  O: Filed Vitals:   03/20/13 1423  BP: 134/74  Pulse: 100  Temp: 98.9 F (37.2 C)  Resp: 16   GEN: In NAD; WN,WD. HENT: [place]/AT; EOMI w/ clear conj/sclerae. EACs/nose/oroph normal. COR: RRR; no mg/gr. LUNGS: CTA; no wheezes, rales or rhonchi. SKIN: W&D. No pallor or erythema. MS: MAEs; no deformities. NEURO: A&O x 3; CNs intact. Nonfocal. Gait is normal.  ECG: NSR; no ST-TW changes. No ectopy.  A/P: Pre-operative cardiovascular examination - pt is is good health w/ low cardiovascular risk for surgery and general anesthesia.  Plan: EKG 12-Lead  HTN (hypertension) -well controlled and stable.  Type II DM is well controlled w/ nutrition management and exercise. Last A1c in Dec  2103= 6.2%.  Will recommend monitoring blood sugar in the peri-operative period.   Pre-operative clearance form to be faxed to [facility].

## 2013-03-20 NOTE — Patient Instructions (Signed)
You have been cleared for lumbar surgery; your exam is normal as is your electrocardiogram. You have been provided with a copy to take to your pre-op visit with th anesthesiologist at the hospital. I trust that you will have an uneventful surgery and recovery. Schedule to return here after you have finished rehab or sooner if you have any acute issues not related to your surgery.

## 2013-03-23 DIAGNOSIS — M4310 Spondylolisthesis, site unspecified: Secondary | ICD-10-CM | POA: Diagnosis not present

## 2013-03-26 ENCOUNTER — Ambulatory Visit (HOSPITAL_COMMUNITY)
Admission: RE | Admit: 2013-03-26 | Discharge: 2013-03-26 | Disposition: A | Discharge status: Home / Self Care | Payer: BC Managed Care – PPO | Source: Ambulatory Visit | Attending: Orthopedic Surgery | Admitting: Orthopedic Surgery

## 2013-03-26 ENCOUNTER — Encounter (HOSPITAL_COMMUNITY): Payer: Self-pay

## 2013-03-26 ENCOUNTER — Encounter (HOSPITAL_COMMUNITY)
Admission: RE | Admit: 2013-03-26 | Discharge: 2013-03-26 | Disposition: A | Discharge status: Home / Self Care | Payer: BC Managed Care – PPO | Source: Ambulatory Visit | Attending: Orthopedic Surgery | Admitting: Orthopedic Surgery

## 2013-03-26 DIAGNOSIS — Q762 Congenital spondylolisthesis: Secondary | ICD-10-CM | POA: Insufficient documentation

## 2013-03-26 DIAGNOSIS — M4310 Spondylolisthesis, site unspecified: Secondary | ICD-10-CM | POA: Diagnosis not present

## 2013-03-26 DIAGNOSIS — Z01818 Encounter for other preprocedural examination: Secondary | ICD-10-CM | POA: Insufficient documentation

## 2013-03-26 DIAGNOSIS — J984 Other disorders of lung: Secondary | ICD-10-CM | POA: Diagnosis not present

## 2013-03-26 DIAGNOSIS — Z0183 Encounter for blood typing: Secondary | ICD-10-CM | POA: Insufficient documentation

## 2013-03-26 DIAGNOSIS — Z01812 Encounter for preprocedural laboratory examination: Secondary | ICD-10-CM | POA: Insufficient documentation

## 2013-03-26 DIAGNOSIS — M4120 Other idiopathic scoliosis, site unspecified: Secondary | ICD-10-CM | POA: Insufficient documentation

## 2013-03-26 HISTORY — DX: Essential (primary) hypertension: I10

## 2013-03-26 HISTORY — DX: Unspecified osteoarthritis, unspecified site: M19.90

## 2013-03-26 LAB — BASIC METABOLIC PANEL
CO2: 27 mEq/L (ref 19–32)
Calcium: 9.5 mg/dL (ref 8.4–10.5)
Glucose, Bld: 104 mg/dL — ABNORMAL HIGH (ref 70–99)
Potassium: 3.9 mEq/L (ref 3.5–5.1)
Sodium: 139 mEq/L (ref 135–145)

## 2013-03-26 LAB — CBC
Hemoglobin: 13.3 g/dL (ref 12.0–15.0)
Platelets: 350 10*3/uL (ref 150–400)
RBC: 4.37 MIL/uL (ref 3.87–5.11)
WBC: 8.6 10*3/uL (ref 4.0–10.5)

## 2013-03-26 LAB — TYPE AND SCREEN
ABO/RH(D): O POS
Antibody Screen: NEGATIVE

## 2013-03-26 LAB — SURGICAL PCR SCREEN
MRSA, PCR: NEGATIVE
Staphylococcus aureus: NEGATIVE

## 2013-03-26 NOTE — Pre-Procedure Instructions (Signed)
Erica Thomas  03/26/2013   Your procedure is scheduled on:  Wednesday, June 25th  Report to [facility] Stay [facility] at 1030 AM. Come to main entrance "A" and go to east elevators up to 3rd floor. Check in at short stay desk.  Call this number if you have problems the morning of surgery: [phone]   Remember:   Do not eat food or drink liquids after midnight.   Take these medicines the morning of surgery with A SIP OF WATER: Ultram if needed   Do not wear jewelry, make-up or nail polish.  Do not wear lotions, powders, or perfume, deodorant.  Do not shave 48 hours prior to surgery. Men may shave face and neck.  Do not bring valuables to the hospital.  [facility] is not responsible  for any belongings or valuables.  Contacts, dentures or bridgework may not be worn into surgery.  Leave suitcase in the car. After surgery it may be brought to your room.  For patients admitted to the hospital, checkout time is 11:00 AM the day of  discharge.   Patients discharged the day of surgery will not be allowed to drive home.    Special Instructions: Shower using CHG 2 nights before surgery and the night before surgery.  If you shower the day of surgery use CHG.  Use special wash - you have one bottle of CHG for all showers.  You should use approximately 1/3 of the bottle for each shower.   Please read over the following fact sheets that you were given: Pain Booklet, Coughing and Deep Breathing, Blood Transfusion Information, MRSA Information and Surgical Site Infection Prevention

## 2013-03-26 NOTE — Progress Notes (Signed)
Primary PHysician - Dr. Audria Nine Does not have a cardiologist ekg in epic from June 2014 no other recent cardiac testing

## 2013-03-27 MED ORDER — DEXAMETHASONE SODIUM PHOSPHATE 4 MG/ML IJ SOLN
4.0000 mg | Freq: Once | INTRAMUSCULAR | Status: AC
  Administered 2013-03-28: 4 mg via INTRAVENOUS
  Filled 2013-03-27 (×2): qty 1

## 2013-03-27 MED ORDER — ACETAMINOPHEN 10 MG/ML IV SOLN
1000.0000 mg | Freq: Once | INTRAVENOUS | Status: AC
  Administered 2013-03-28: 1000 mg via INTRAVENOUS
  Filled 2013-03-27: qty 100

## 2013-03-27 MED ORDER — VANCOMYCIN HCL IN DEXTROSE 1-5 GM/200ML-% IV SOLN
1000.0000 mg | Freq: Once | INTRAVENOUS | Status: AC
  Administered 2013-03-28: 1000 mg via INTRAVENOUS
  Filled 2013-03-27: qty 200

## 2013-03-28 ENCOUNTER — Inpatient Hospital Stay (HOSPITAL_COMMUNITY): Payer: BC Managed Care – PPO

## 2013-03-28 ENCOUNTER — Inpatient Hospital Stay (HOSPITAL_COMMUNITY)
Admission: RE | Admit: 2013-03-28 | Discharge: 2013-03-31 | DRG: 756 | Disposition: A | Discharge status: Home / Self Care | Payer: BC Managed Care – PPO | Source: Ambulatory Visit | Attending: Orthopedic Surgery | Admitting: Orthopedic Surgery

## 2013-03-28 ENCOUNTER — Encounter (HOSPITAL_COMMUNITY): Payer: Self-pay | Admitting: *Deleted

## 2013-03-28 ENCOUNTER — Encounter (HOSPITAL_COMMUNITY): Payer: Self-pay | Admitting: Anesthesiology

## 2013-03-28 ENCOUNTER — Encounter (HOSPITAL_COMMUNITY)
Admission: RE | Disposition: A | Discharge status: Home / Self Care | Payer: Self-pay | Source: Ambulatory Visit | Attending: Orthopedic Surgery

## 2013-03-28 ENCOUNTER — Inpatient Hospital Stay (HOSPITAL_COMMUNITY): Payer: BC Managed Care – PPO | Admitting: Anesthesiology

## 2013-03-28 DIAGNOSIS — M4310 Spondylolisthesis, site unspecified: Secondary | ICD-10-CM | POA: Diagnosis not present

## 2013-03-28 DIAGNOSIS — IMO0002 Reserved for concepts with insufficient information to code with codable children: Secondary | ICD-10-CM | POA: Diagnosis not present

## 2013-03-28 DIAGNOSIS — M519 Unspecified thoracic, thoracolumbar and lumbosacral intervertebral disc disorder: Secondary | ICD-10-CM | POA: Diagnosis not present

## 2013-03-28 DIAGNOSIS — M48061 Spinal stenosis, lumbar region without neurogenic claudication: Secondary | ICD-10-CM | POA: Diagnosis present

## 2013-03-28 DIAGNOSIS — Q762 Congenital spondylolisthesis: Principal | ICD-10-CM

## 2013-03-28 DIAGNOSIS — E7800 Pure hypercholesterolemia, unspecified: Secondary | ICD-10-CM | POA: Diagnosis present

## 2013-03-28 DIAGNOSIS — I10 Essential (primary) hypertension: Secondary | ICD-10-CM | POA: Diagnosis present

## 2013-03-28 DIAGNOSIS — M545 Low back pain, unspecified: Secondary | ICD-10-CM | POA: Diagnosis not present

## 2013-03-28 DIAGNOSIS — E119 Type 2 diabetes mellitus without complications: Secondary | ICD-10-CM | POA: Diagnosis not present

## 2013-03-28 DIAGNOSIS — M5126 Other intervertebral disc displacement, lumbar region: Secondary | ICD-10-CM | POA: Diagnosis not present

## 2013-03-28 HISTORY — PX: SPINAL CORD DECOMPRESSION: SHX97

## 2013-03-28 LAB — GLUCOSE, CAPILLARY: Glucose-Capillary: 147 mg/dL — ABNORMAL HIGH (ref 70–99)

## 2013-03-28 SURGERY — POSTERIOR LUMBAR FUSION 1 LEVEL
Anesthesia: General | Site: Spine Lumbar | Wound class: Clean

## 2013-03-28 MED ORDER — MORPHINE SULFATE (PF) 1 MG/ML IV SOLN
INTRAVENOUS | Status: DC
  Administered 2013-03-28: 2 mg via INTRAVENOUS
  Administered 2013-03-29: 1 mg via INTRAVENOUS
  Administered 2013-03-29: 7 mg via INTRAVENOUS

## 2013-03-28 MED ORDER — DEXAMETHASONE 4 MG PO TABS
4.0000 mg | ORAL_TABLET | Freq: Four times a day (QID) | ORAL | Status: DC
  Administered 2013-03-28: 4 mg via ORAL
  Filled 2013-03-28 (×7): qty 1

## 2013-03-28 MED ORDER — HYDROMORPHONE HCL PF 1 MG/ML IJ SOLN: 0.2500 mg | INTRAMUSCULAR | Status: DC | PRN

## 2013-03-28 MED ORDER — ACETAMINOPHEN 10 MG/ML IV SOLN
1000.0000 mg | Freq: Four times a day (QID) | INTRAVENOUS | Status: AC
  Administered 2013-03-28 – 2013-03-29 (×4): 1000 mg via INTRAVENOUS
  Filled 2013-03-28 (×4): qty 100

## 2013-03-28 MED ORDER — ACETAMINOPHEN 10 MG/ML IV SOLN
INTRAVENOUS | Status: AC
  Filled 2013-03-28: qty 100

## 2013-03-28 MED ORDER — THROMBIN 20000 UNITS EX SOLR
CUTANEOUS | Status: DC | PRN
  Administered 2013-03-28: 14:00:00 via TOPICAL

## 2013-03-28 MED ORDER — ONDANSETRON HCL 4 MG/2ML IJ SOLN
INTRAMUSCULAR | Status: DC | PRN
  Administered 2013-03-28: 4 mg via INTRAVENOUS

## 2013-03-28 MED ORDER — MENTHOL 3 MG MT LOZG
1.0000 | LOZENGE | OROMUCOSAL | Status: DC | PRN
  Filled 2013-03-28: qty 9

## 2013-03-28 MED ORDER — SODIUM CHLORIDE 0.9 % IJ SOLN: 9.0000 mL | INTRAMUSCULAR | Status: DC | PRN

## 2013-03-28 MED ORDER — MIDAZOLAM HCL 5 MG/5ML IJ SOLN
INTRAMUSCULAR | Status: DC | PRN
  Administered 2013-03-28 (×2): 1 mg via INTRAVENOUS

## 2013-03-28 MED ORDER — MORPHINE SULFATE (PF) 1 MG/ML IV SOLN
INTRAVENOUS | Status: AC
  Filled 2013-03-28: qty 25

## 2013-03-28 MED ORDER — PHENYLEPHRINE HCL 10 MG/ML IJ SOLN
INTRAMUSCULAR | Status: DC | PRN
  Administered 2013-03-28 (×2): 40 ug via INTRAVENOUS
  Administered 2013-03-28: 80 ug via INTRAVENOUS
  Administered 2013-03-28: 160 ug via INTRAVENOUS
  Administered 2013-03-28: 80 ug via INTRAVENOUS
  Administered 2013-03-28: 40 ug via INTRAVENOUS
  Administered 2013-03-28: 80 ug via INTRAVENOUS
  Administered 2013-03-28: 40 ug via INTRAVENOUS

## 2013-03-28 MED ORDER — HEMOSTATIC AGENTS (NO CHARGE) OPTIME
TOPICAL | Status: DC | PRN
  Administered 2013-03-28: 1 via TOPICAL

## 2013-03-28 MED ORDER — VANCOMYCIN HCL IN DEXTROSE 1-5 GM/200ML-% IV SOLN
1000.0000 mg | Freq: Two times a day (BID) | INTRAVENOUS | Status: AC
  Administered 2013-03-28 – 2013-03-29 (×2): 1000 mg via INTRAVENOUS
  Filled 2013-03-28 (×2): qty 200

## 2013-03-28 MED ORDER — ALBUMIN HUMAN 5 % IV SOLN
INTRAVENOUS | Status: DC | PRN
  Administered 2013-03-28: 14:00:00 via INTRAVENOUS

## 2013-03-28 MED ORDER — SODIUM CHLORIDE 0.9 % IJ SOLN
3.0000 mL | Freq: Two times a day (BID) | INTRAMUSCULAR | Status: DC
  Administered 2013-03-30: 3 mL via INTRAVENOUS

## 2013-03-28 MED ORDER — LACTATED RINGERS IV SOLN
INTRAVENOUS | Status: DC
  Administered 2013-03-28 – 2013-03-29 (×2): via INTRAVENOUS

## 2013-03-28 MED ORDER — BUPIVACAINE-EPINEPHRINE 0.25% -1:200000 IJ SOLN
INTRAMUSCULAR | Status: DC | PRN
  Administered 2013-03-28: 10 mL

## 2013-03-28 MED ORDER — DIPHENHYDRAMINE HCL 50 MG/ML IJ SOLN: 12.5000 mg | Freq: Four times a day (QID) | INTRAMUSCULAR | Status: DC | PRN

## 2013-03-28 MED ORDER — FENTANYL CITRATE 0.05 MG/ML IJ SOLN
INTRAMUSCULAR | Status: DC | PRN
  Administered 2013-03-28: 100 ug via INTRAVENOUS
  Administered 2013-03-28 (×4): 50 ug via INTRAVENOUS

## 2013-03-28 MED ORDER — MORPHINE SULFATE 2 MG/ML IJ SOLN: 1.0000 mg | INTRAMUSCULAR | Status: DC | PRN

## 2013-03-28 MED ORDER — HYDROCHLOROTHIAZIDE 25 MG PO TABS
25.0000 mg | ORAL_TABLET | Freq: Every day | ORAL | Status: DC
  Administered 2013-03-31: 25 mg via ORAL
  Filled 2013-03-28 (×4): qty 1

## 2013-03-28 MED ORDER — LISINOPRIL-HYDROCHLOROTHIAZIDE 20-25 MG PO TABS: 1.0000 | ORAL_TABLET | Freq: Every day | ORAL | Status: DC

## 2013-03-28 MED ORDER — PROPOFOL INFUSION 10 MG/ML OPTIME
INTRAVENOUS | Status: DC | PRN
  Administered 2013-03-28 (×2): 50 ug/kg/min via INTRAVENOUS

## 2013-03-28 MED ORDER — NALOXONE HCL 0.4 MG/ML IJ SOLN: 0.4000 mg | INTRAMUSCULAR | Status: DC | PRN

## 2013-03-28 MED ORDER — DEXTROSE 5 % IV SOLN
INTRAVENOUS | Status: DC | PRN
  Administered 2013-03-28: 12:00:00 via INTRAVENOUS

## 2013-03-28 MED ORDER — METHOCARBAMOL 100 MG/ML IJ SOLN
500.0000 mg | Freq: Four times a day (QID) | INTRAVENOUS | Status: DC | PRN
  Filled 2013-03-28: qty 5

## 2013-03-28 MED ORDER — ONDANSETRON HCL 4 MG/2ML IJ SOLN: 4.0000 mg | INTRAMUSCULAR | Status: DC | PRN

## 2013-03-28 MED ORDER — DEXAMETHASONE SODIUM PHOSPHATE 4 MG/ML IJ SOLN
4.0000 mg | Freq: Four times a day (QID) | INTRAMUSCULAR | Status: DC
  Administered 2013-03-29 (×2): 4 mg via INTRAVENOUS
  Filled 2013-03-28 (×7): qty 1

## 2013-03-28 MED ORDER — BUPIVACAINE-EPINEPHRINE PF 0.25-1:200000 % IJ SOLN
INTRAMUSCULAR | Status: AC
  Filled 2013-03-28: qty 30

## 2013-03-28 MED ORDER — SODIUM CHLORIDE 0.9 % IJ SOLN: 3.0000 mL | INTRAMUSCULAR | Status: DC | PRN

## 2013-03-28 MED ORDER — LIDOCAINE HCL (CARDIAC) 20 MG/ML IV SOLN
INTRAVENOUS | Status: DC | PRN
  Administered 2013-03-28: 70 mg via INTRAVENOUS

## 2013-03-28 MED ORDER — DEXAMETHASONE SODIUM PHOSPHATE 10 MG/ML IJ SOLN
INTRAMUSCULAR | Status: DC | PRN
  Administered 2013-03-28: 4 mg via INTRAVENOUS

## 2013-03-28 MED ORDER — OXYCODONE HCL 5 MG PO TABS: 5.0000 mg | ORAL_TABLET | Freq: Once | ORAL | Status: DC | PRN

## 2013-03-28 MED ORDER — ONDANSETRON HCL 4 MG/2ML IJ SOLN: 4.0000 mg | Freq: Four times a day (QID) | INTRAMUSCULAR | Status: DC | PRN

## 2013-03-28 MED ORDER — ATORVASTATIN CALCIUM 40 MG PO TABS
40.0000 mg | ORAL_TABLET | Freq: Every day | ORAL | Status: DC
  Administered 2013-03-28 – 2013-03-30 (×3): 40 mg via ORAL
  Filled 2013-03-28 (×5): qty 1

## 2013-03-28 MED ORDER — ARTIFICIAL TEARS OP OINT
TOPICAL_OINTMENT | OPHTHALMIC | Status: DC | PRN
  Administered 2013-03-28: 1 via OPHTHALMIC

## 2013-03-28 MED ORDER — SODIUM CHLORIDE 0.9 % IV SOLN: 250.0000 mL | INTRAVENOUS | Status: DC

## 2013-03-28 MED ORDER — PROPOFOL 10 MG/ML IV BOLUS
INTRAVENOUS | Status: DC | PRN
  Administered 2013-03-28: 150 mg via INTRAVENOUS

## 2013-03-28 MED ORDER — OXYCODONE HCL 5 MG/5ML PO SOLN: 5.0000 mg | Freq: Once | ORAL | Status: DC | PRN

## 2013-03-28 MED ORDER — METHOCARBAMOL 500 MG PO TABS
500.0000 mg | ORAL_TABLET | Freq: Four times a day (QID) | ORAL | Status: DC | PRN
  Administered 2013-03-29 – 2013-03-31 (×3): 500 mg via ORAL
  Filled 2013-03-28 (×3): qty 1

## 2013-03-28 MED ORDER — LACTATED RINGERS IV SOLN
INTRAVENOUS | Status: DC
  Administered 2013-03-28: 18:00:00 via INTRAVENOUS

## 2013-03-28 MED ORDER — LACTATED RINGERS IV SOLN
INTRAVENOUS | Status: DC | PRN
  Administered 2013-03-28 (×2): via INTRAVENOUS

## 2013-03-28 MED ORDER — LISINOPRIL 20 MG PO TABS
20.0000 mg | ORAL_TABLET | Freq: Every day | ORAL | Status: DC
  Administered 2013-03-31: 20 mg via ORAL
  Filled 2013-03-28 (×4): qty 1

## 2013-03-28 MED ORDER — ZOLPIDEM TARTRATE 5 MG PO TABS: 5.0000 mg | ORAL_TABLET | Freq: Every evening | ORAL | Status: DC | PRN

## 2013-03-28 MED ORDER — DIPHENHYDRAMINE HCL 12.5 MG/5ML PO ELIX: 12.5000 mg | ORAL_SOLUTION | Freq: Four times a day (QID) | ORAL | Status: DC | PRN

## 2013-03-28 MED ORDER — THROMBIN 20000 UNITS EX SOLR
CUTANEOUS | Status: AC
  Filled 2013-03-28: qty 20000

## 2013-03-28 MED ORDER — 0.9 % SODIUM CHLORIDE (POUR BTL) OPTIME
TOPICAL | Status: DC | PRN
  Administered 2013-03-28: 1000 mL

## 2013-03-28 MED ORDER — SUCCINYLCHOLINE CHLORIDE 20 MG/ML IJ SOLN
INTRAMUSCULAR | Status: DC | PRN
  Administered 2013-03-28: 100 mg via INTRAVENOUS

## 2013-03-28 MED ORDER — PHENOL 1.4 % MT LIQD: 1.0000 | OROMUCOSAL | Status: DC | PRN

## 2013-03-28 MED ORDER — PROMETHAZINE HCL 25 MG/ML IJ SOLN: 6.2500 mg | INTRAMUSCULAR | Status: DC | PRN

## 2013-03-28 MED ORDER — OXYCODONE HCL 5 MG PO TABS
10.0000 mg | ORAL_TABLET | ORAL | Status: DC | PRN
  Administered 2013-03-29: 5 mg via ORAL
  Administered 2013-03-29: 10 mg via ORAL
  Administered 2013-03-30 (×2): 5 mg via ORAL
  Administered 2013-03-30 – 2013-03-31 (×4): 10 mg via ORAL
  Filled 2013-03-28 (×2): qty 2
  Filled 2013-03-28 (×3): qty 1
  Filled 2013-03-28 (×2): qty 2
  Filled 2013-03-28: qty 1
  Filled 2013-03-28: qty 2
  Filled 2013-03-28: qty 1

## 2013-03-28 SURGICAL SUPPLY — 67 items
BLADE SURG ROTATE 9660 (MISCELLANEOUS) IMPLANT
BUR EGG ELITE 4.0 (BURR) IMPLANT
CAP SPINAL LOCKING TI (Cap) ×10 IMPLANT
CLOTH BEACON ORANGE TIMEOUT ST (SAFETY) ×2 IMPLANT
CLSR STERI-STRIP ANTIMIC 1/2X4 (GAUZE/BANDAGES/DRESSINGS) ×2 IMPLANT
CORDS BIPOLAR (ELECTRODE) ×2 IMPLANT
COVER MAYO STAND STRL (DRAPES) ×4 IMPLANT
COVER SURGICAL LIGHT HANDLE (MISCELLANEOUS) ×2 IMPLANT
DERMABOND ADVANCED (GAUZE/BANDAGES/DRESSINGS) ×1
DERMABOND ADVANCED .7 DNX12 (GAUZE/BANDAGES/DRESSINGS) ×1 IMPLANT
DRAPE C-ARM 42X72 X-RAY (DRAPES) ×2 IMPLANT
DRAPE ORTHO SPLIT 77X108 STRL (DRAPES) ×1
DRAPE POUCH INSTRU U-SHP 10X18 (DRAPES) ×2 IMPLANT
DRAPE SURG 17X23 STRL (DRAPES) ×2 IMPLANT
DRAPE SURG ORHT 6 SPLT 77X108 (DRAPES) ×1 IMPLANT
DRAPE U-SHAPE 47X51 STRL (DRAPES) ×2 IMPLANT
DRSG MEPILEX BORDER 4X8 (GAUZE/BANDAGES/DRESSINGS) ×2 IMPLANT
DURAPREP 26ML APPLICATOR (WOUND CARE) ×2 IMPLANT
ELECT BLADE 4.0 EZ CLEAN MEGAD (MISCELLANEOUS)
ELECT BLADE 6.5 EXT (BLADE) ×2 IMPLANT
ELECT REM PT RETURN 9FT ADLT (ELECTROSURGICAL) ×2
ELECTRODE BLDE 4.0 EZ CLN MEGD (MISCELLANEOUS) IMPLANT
ELECTRODE REM PT RTRN 9FT ADLT (ELECTROSURGICAL) ×1 IMPLANT
GLOVE BIOGEL PI IND STRL 6.5 (GLOVE) ×1 IMPLANT
GLOVE BIOGEL PI IND STRL 8.5 (GLOVE) ×1 IMPLANT
GLOVE BIOGEL PI INDICATOR 6.5 (GLOVE) ×1
GLOVE BIOGEL PI INDICATOR 8.5 (GLOVE) ×1
GLOVE ECLIPSE 6.0 STRL STRAW (GLOVE) ×2 IMPLANT
GLOVE ECLIPSE 8.5 STRL (GLOVE) ×2 IMPLANT
GOWN PREVENTION PLUS XXLARGE (GOWN DISPOSABLE) ×2 IMPLANT
GOWN STRL NON-REIN LRG LVL3 (GOWN DISPOSABLE) ×4 IMPLANT
GUIDEWIRE 1.6X480MM (WIRE) ×4 IMPLANT
IV CATH 14GX2 1/4 (CATHETERS) IMPLANT
KIT BASIN OR (CUSTOM PROCEDURE TRAY) ×2 IMPLANT
KIT ORACLE NEUROMONITING (KITS) ×2 IMPLANT
KIT POSITION SURG JACKSON T1 (MISCELLANEOUS) IMPLANT
KIT ROOM TURNOVER OR (KITS) ×2 IMPLANT
NEEDLE 22X1 1/2 (OR ONLY) (NEEDLE) ×2 IMPLANT
NEEDLE I-PASS III (NEEDLE) ×2 IMPLANT
NEEDLE SPNL 18GX3.5 QUINCKE PK (NEEDLE) ×2 IMPLANT
NS IRRIG 1000ML POUR BTL (IV SOLUTION) ×2 IMPLANT
PACK LAMINECTOMY ORTHO (CUSTOM PROCEDURE TRAY) ×2 IMPLANT
PACK UNIVERSAL I (CUSTOM PROCEDURE TRAY) ×2 IMPLANT
PAD ARMBOARD 7.5X6 YLW CONV (MISCELLANEOUS) ×4 IMPLANT
PATTIES SURGICAL .5 X.5 (GAUZE/BANDAGES/DRESSINGS) IMPLANT
PATTIES SURGICAL .5 X1 (DISPOSABLE) ×2 IMPLANT
PUTTY BONE DBX 5CC MIX (Putty) ×2 IMPLANT
ROD 40MM (Rod) ×1 IMPLANT
ROD MATRIX MIS 40MM (Rod) ×2 IMPLANT
ROD SPNL CVD 40X5.5XHRD NS (Rod) ×1 IMPLANT
SCREW MATRIX MIS 6.0X35MM (Screw) ×4 IMPLANT
SCREW MATRIX MIS 6.0X40MM (Screw) ×4 IMPLANT
SPONGE LAP 4X18 X RAY DECT (DISPOSABLE) ×4 IMPLANT
SPONGE SURGIFOAM ABS GEL 100 (HEMOSTASIS) ×2 IMPLANT
STRIP CLOSURE SKIN 1/2X4 (GAUZE/BANDAGES/DRESSINGS) ×2 IMPLANT
SURGIFLO TRUKIT (HEMOSTASIS) ×2 IMPLANT
SUT MNCRL AB 3-0 PS2 18 (SUTURE) ×4 IMPLANT
SUT VIC AB 2-0 CT1 18 (SUTURE) ×2 IMPLANT
SUT VICRYL 0 UR6 27IN ABS (SUTURE) ×4 IMPLANT
SYR BULB IRRIGATION 50ML (SYRINGE) ×2 IMPLANT
SYR CONTROL 10ML LL (SYRINGE) ×2 IMPLANT
TAP CANN 5MM (TAP) ×2 IMPLANT
TOWEL OR 17X24 6PK STRL BLUE (TOWEL DISPOSABLE) ×2 IMPLANT
TOWEL OR 17X26 10 PK STRL BLUE (TOWEL DISPOSABLE) ×2 IMPLANT
TRAY FOLEY CATH 14FR (SET/KITS/TRAYS/PACK) ×2 IMPLANT
WATER STERILE IRR 1000ML POUR (IV SOLUTION) ×2 IMPLANT
YANKAUER SUCT BULB TIP NO VENT (SUCTIONS) ×2 IMPLANT

## 2013-03-28 NOTE — Anesthesia Postprocedure Evaluation (Signed)
Anesthesia Post Note  Patient: Erica Thomas  Procedure(s) Performed: Procedure(s) (LRB): TLIFT L5-S1 (N/A)  Anesthesia type: General  Patient location: PACU  Post pain: Pain level controlled and Adequate analgesia  Post assessment: Post-op Vital signs reviewed, Patient's Cardiovascular Status Stable, Respiratory Function Stable, Patent Airway and Pain level controlled  Last Vitals:  Filed Vitals:   03/28/13 1545  BP: 134/60  Pulse: 80  Temp: 36.9 C  Resp: 7    Post vital signs: Reviewed and stable  Level of consciousness: awake, alert  and oriented  Complications: No apparent anesthesia complications

## 2013-03-28 NOTE — H&P (Signed)
No change in clinical exam H+P reviewed  

## 2013-03-28 NOTE — Brief Op Note (Cosign Needed)
03/28/2013  3:31 PM  PATIENT:  Erica Thomas  67 y.o. female  PRE-OPERATIVE DIAGNOSIS:  L5-S1 GRADE 2 DEGENERATIVE SLIP  POST-OPERATIVE DIAGNOSIS:  L5-S1 GRADE 2 DEGENERATIVE SLIP  PROCEDURE:  Procedure(s) with comments: TLIFT L5-S1 (N/A) - lumbar five-sacral one gill decompression and posterolateral fusion  SURGEON:  Surgeon(s) and Role:    * Venita Lick, MD - Primary  PHYSICIAN ASSISTANT:   ASSISTANTS: none   ANESTHESIA:   general  EBL:  Total I/O In: 1950 [I.V.:1700; IV Piggyback:250] Out: 450 [Urine:250; Blood:200]  BLOOD ADMINISTERED:none  DRAINS: none   LOCAL MEDICATIONS USED:  MARCAINE     SPECIMEN:  No Specimen  DISPOSITION OF SPECIMEN:  N/A  COUNTS:  YES  TOURNIQUET:  * No tourniquets in log *  DICTATION: .Other Dictation: Dictation Number [id number]  PLAN OF CARE: Admit to inpatient   PATIENT DISPOSITION:  PACU - hemodynamically stable.

## 2013-03-28 NOTE — Transfer of Care (Signed)
Immediate Anesthesia Transfer of Care Note  Patient: Erica Thomas  Procedure(s) Performed: Procedure(s) with comments: TLIFT L5-S1 (N/A) - lumbar five-sacral one gill decompression and posterolateral fusion  Patient Location: PACU  Anesthesia Type:General  Level of Consciousness: awake, alert  and oriented  Airway & Oxygen Therapy: Patient Spontanous Breathing and Patient connected to nasal cannula oxygen  Post-op Assessment: Report given to PACU RN, Post -op Vital signs reviewed and stable and Patient moving all extremities X 4  Post vital signs: Reviewed and stable  Complications: No apparent anesthesia complications

## 2013-03-28 NOTE — Preoperative (Signed)
Beta Blockers   Reason not to administer Beta Blockers:Not Applicable 

## 2013-03-28 NOTE — Anesthesia Preprocedure Evaluation (Addendum)
Anesthesia Evaluation  Patient identified by MRN, date of birth, ID band Patient awake    Reviewed: Allergy & Precautions, H&P , NPO status , Patient's Chart, lab work & pertinent test results, reviewed documented beta blocker date and time   History of Anesthesia Complications Negative for: history of anesthetic complications  Airway Mallampati: II TM Distance: >3 FB Neck ROM: Full    Dental  (+) Teeth Intact, Dental Advisory Given and Partial Lower   Pulmonary neg pulmonary ROS,  breath sounds clear to auscultation        Cardiovascular hypertension, Pt. on medications Rhythm:Regular Rate:Normal     Neuro/Psych negative neurological ROS     GI/Hepatic Neg liver ROS,   Endo/Other  Type 2Diet controlled DM  Renal/GU negative Renal ROS     Musculoskeletal  (+) Arthritis -,   Abdominal (+) + obese,   Peds  Hematology negative hematology ROS (+)   Anesthesia Other Findings   Reproductive/Obstetrics                         Anesthesia Physical Anesthesia Plan  ASA: II  Anesthesia Plan: General   Post-op Pain Management:    Induction: Intravenous  Airway Management Planned: Oral ETT  Additional Equipment:   Intra-op Plan:   Post-operative Plan: Extubation in OR  Informed Consent: I have reviewed the patients History and Physical, chart, labs and discussed the procedure including the risks, benefits and alternatives for the proposed anesthesia with the patient or authorized representative who has indicated his/her understanding and acceptance.   Dental advisory given  Plan Discussed with: CRNA and Surgeon  Anesthesia Plan Comments:         Anesthesia Quick Evaluation

## 2013-03-29 ENCOUNTER — Inpatient Hospital Stay (HOSPITAL_COMMUNITY): Payer: BC Managed Care – PPO

## 2013-03-29 LAB — GLUCOSE, CAPILLARY: Glucose-Capillary: 154 mg/dL — ABNORMAL HIGH (ref 70–99)

## 2013-03-29 NOTE — Evaluation (Signed)
Physical Therapy Evaluation Patient Details Name: Erica Thomas MRN: [id number] DOB: [birth year] Today's Date: 03/29/2013 Time: 4098-1191 PT Time Calculation (min): 13 min  PT Assessment / Plan / Recommendation History of Present Illness  s/p TLIFT L5-S1  Clinical Impression  Pt doing remarkable from mobility standpoint for POD#1. All mobility and transfers at mod I (A). Pt able to recall 3/3 back precautions and demo good understanding of them. Would benefit from 3 in 1 RW initially to stabilize during gt/transfers. Pt defers the need for HHPT. Will sign off from acute PT standpoint at this time. Is safe to D/C home with intermittent (A) from neighbor/friend.     PT Assessment  Patent does not need any further PT services    Follow Up Recommendations  Supervision - Intermittent    Does the patient have the potential to tolerate intense rehabilitation      Barriers to Discharge  none      Equipment Recommendations  Rolling walker with 5" wheels    Recommendations for Other Services   n/a  Frequency   n/a   Precautions / Restrictions Precautions Precautions: Back;Fall Precaution Booklet Issued: Yes (comment) Precaution Comments: educated in back precautions; able to recall 2/3 at beginning to session and 3/3 at end of session Required Braces or Orthoses: Spinal Brace Spinal Brace: Lumbar corset;Applied in sitting position Restrictions Weight Bearing Restrictions: No   Pertinent Vitals/Pain 1-2/10; pt premedicated.       Mobility  Bed Mobility Bed Mobility: Sit to Sidelying Left;Rolling Left;Left Sidelying to Sit Rolling Right: 6: Modified independent (Device/Increase time) Rolling Left: 6: Modified independent (Device/Increase time) Left Sidelying to Sit: 6: Modified independent (Device/Increase time) Sit to Sidelying Left: 6: Modified independent (Device/Increase time);HOB flat Details for Bed Mobility Assistance: Pt has been log rolling for several months. demo good  technique  Transfers Transfers: Sit to Stand;Stand to Sit Sit to Stand: With upper extremity assist;From chair/3-in-1;6: Modified independent (Device/Increase time) Stand to Sit: With upper extremity assist;To toilet;To bed;6: Modified independent (Device/Increase time) Details for Transfer Assistance: pt demo good technique and safety awareness; able to adhere to back precautions with transfers Ambulation/Gait Ambulation/Gait Assistance: 6: Modified independent (Device/Increase time);5: Supervision Ambulation Distance (Feet): 400 Feet Assistive device: Other (Comment) (IV pole ) Ambulation/Gait Assistance Details: pt stated she felt "a little unsteady" today with walking; prefers to amb with RW and demos increased stability with RW vs without AD; pt intitally supervision progressed to mod i, able to demo good technique and safety with turns  Gait Pattern: Within Functional Limits Gait velocity: decr due to pain  Stairs: Yes Stairs Assistance: 5: Supervision Stairs Assistance Details (indicate cue type and reason): verbal cues to amb up with stronger leg (R LE) and down with L LE; pt demo good technique and safety Stair Management Technique: Two rails;Step to pattern;Forwards Number of Stairs: 5 Wheelchair Mobility Wheelchair Mobility: No    Education   reinforced back precautions    PT Diagnosis:   acute pain  PT Problem List:   PT Treatment Interventions:       PT Goals(Current goals can be found in the care plan section) Acute Rehab PT Goals Patient Stated Goal: Return to independence. PT Goal Formulation: No goals set, d/c therapy  Visit Information  Last PT Received On: 03/29/13 Assistance Needed: +1 History of Present Illness: s/p TLIFT L5-S1       Prior Functioning  Home Living Family/patient expects to be discharged to:: Private residence Living Arrangements: Alone Available Help at  Discharge: Neighbor;Available PRN/intermittently Type of Home: House Home Access:  Stairs to enter [employer] of Steps: 3 Entrance Stairs-Rails: Right Home Layout: One level Home Equipment: None Prior Function Level of Independence: Independent Communication Communication: No difficulties Dominant Hand: Left    Cognition  Cognition Arousal/Alertness: Awake/alert Behavior During Therapy: WFL for tasks assessed/performed Overall Cognitive Status: Within Functional Limits for tasks assessed    Extremity/Trunk Assessment Upper Extremity Assessment Upper Extremity Assessment: Overall WFL for tasks assessed Lower Extremity Assessment Lower Extremity Assessment: Overall WFL for tasks assessed Cervical / Trunk Assessment Cervical / Trunk Assessment: Normal   Balance Balance Balance Assessed: Yes Static Sitting Balance Static Sitting - Balance Support: No upper extremity supported;Feet supported Static Sitting - Level of Assistance: 6: Modified independent (Device/Increase time) Static Sitting - Comment/# of Minutes: tolerated sitting EOB ~3 min to donn brace  Static Standing Balance Static Standing - Balance Support: Right upper extremity supported;During functional activity Static Standing - Level of Assistance: 5: Stand by assistance  End of Session PT - End of Session Equipment Utilized During Treatment: Gait belt;Back brace Activity Tolerance: Patient tolerated treatment well Patient left: in bed;with call bell/phone within reach Nurse Communication: Mobility status  GP     Donell Sievert, Stella [phone] 03/29/2013, 3:10 PM

## 2013-03-29 NOTE — Evaluation (Signed)
Occupational Therapy Evaluation Patient Details Name: Erica Thomas MRN: [id number] DOB: 08/20/1946 Today's Date: 03/29/2013 Time: 4098-1191 OT Time Calculation (min): 37 min  OT Assessment / Plan / Recommendation History of present illness s/p TLIFT L5-S1   Clinical Impression   Pt is performing remarkably well for POD 1.  Pt presents requiring min to supervision assist for ADL and mobility.  Instructed in use of 3 in1 and AE for LB ADL as well as back precautions.  Pt plans to return home with intermittent assist of her neighbor.  Needs to practice tub transfer.    OT Assessment  Patient needs continued OT Services    Follow Up Recommendations  No OT follow up;Supervision - Intermittent    Barriers to Discharge      Equipment Recommendations  3 in 1 bedside comode    Recommendations for Other Services    Frequency  Min 2X/week    Precautions / Restrictions Precautions Precautions: Back;Fall Precaution Booklet Issued: Yes (comment) Precaution Comments: educated in back precautions Required Braces or Orthoses: Spinal Brace Spinal Brace: Lumbar corset;Applied in sitting position   Pertinent Vitals/Pain "soreness" in back at incision site, premedicated, repositioned    ADL  Eating/Feeding: Independent Where Assessed - Eating/Feeding: Chair Grooming: Wash/dry hands;Supervision/safety Where Assessed - Grooming: Unsupported sitting Upper Body Bathing: Set up Where Assessed - Upper Body Bathing: Unsupported sitting Lower Body Bathing: Minimal assistance Where Assessed - Lower Body Bathing: Unsupported sitting;Supported sit to stand Upper Body Dressing: Set up Where Assessed - Upper Body Dressing: Unsupported sitting Lower Body Dressing: Minimal assistance Where Assessed - Lower Body Dressing: Unsupported sitting;Supported sit to stand Toilet Transfer: Supervision/safety Toilet Transfer Method: Sit to Barista: Comfort height toilet;Grab  bars Toileting - Architect and Hygiene: Modified independent Where Assessed - Engineer, mining and Hygiene: Sit on 3-in-1 or toilet Equipment Used: Gait belt;Long-handled shoe horn;Long-handled sponge;Reacher;Rolling walker;Sock aid;Back brace Transfers/Ambulation Related to ADLs: supervision with RW ADL Comments: Educated in back precautions related to LB ADL and use of AE and DME.    OT Diagnosis: Generalized weakness;Acute pain  OT Problem List: Decreased strength;Impaired balance (sitting and/or standing);Decreased knowledge of use of DME or AE;Decreased knowledge of precautions;Obesity;Pain OT Treatment Interventions: Self-care/ADL training;DME and/or AE instruction;Patient/family education   OT Goals(Current goals can be found in the care plan section) Acute Rehab OT Goals Patient Stated Goal: Return to independence. OT Goal Formulation: With patient Time For Goal Achievement: 04/05/13 Potential to Achieve Goals: Good  Visit Information  Last OT Received On: 03/29/13 Assistance Needed: +1 History of Present Illness: s/p TLIFT L5-S1       Prior Functioning     Home Living Family/patient expects to be discharged to:: Private residence Living Arrangements: Alone Available Help at Discharge: Neighbor;Available PRN/intermittently Type of Home: House Home Access: Stairs to enter [employer] of Steps: 3 Entrance Stairs-Rails: Right Home Layout: One level Home Equipment: None Prior Function Level of Independence: Independent Communication Communication: No difficulties Dominant Hand: Left         Vision/Perception     Cognition  Cognition Arousal/Alertness: Awake/alert Behavior During Therapy: WFL for tasks assessed/performed Overall Cognitive Status: Within Functional Limits for tasks assessed    Extremity/Trunk Assessment Upper Extremity Assessment Upper Extremity Assessment: Overall WFL for tasks assessed Cervical /  Trunk Assessment Cervical / Trunk Assessment: Normal     Mobility Bed Mobility Bed Mobility: Sit to Sidelying Left;Rolling Right Rolling Right: 6: Modified independent (Device/Increase time) Sit to Sidelying Left: 6: Modified  independent (Device/Increase time);HOB flat Details for Bed Mobility Assistance: Pt has been log rolling for several months. Transfers Transfers: Sit to Stand;Stand to Sit Sit to Stand: 5: Supervision;With upper extremity assist;From chair/3-in-1 Stand to Sit: 5: Supervision;With upper extremity assist;To toilet;To bed     Exercise     Balance     End of Session OT - End of Session Activity Tolerance: Patient tolerated treatment well Patient left: in bed;with call bell/phone within reach  GO     Evern Bio 03/29/2013, 1:32 PM [phone]

## 2013-03-29 NOTE — Progress Notes (Signed)
Orthopedic Tech Progress Note Patient Details:  Erica Thomas November 20, 1945 [id number] Patient was fit with brace prior to surgery Patient ID: Erica Thomas, female   DOB: [birth year], 67 y.o.   MRN: [id number]   [name] 03/29/2013, 10:38 AM

## 2013-03-29 NOTE — Progress Notes (Signed)
SW received a consult for possible placement. PT  At this time is recommending supervision- intermittent and not  SNF. CSW will make CM aware. Clinical Social Worker will sign off for now as social work intervention is no longer needed. Please consult [place] again if new need arises.   Sabino Niemann, MSW [phone]

## 2013-03-29 NOTE — Progress Notes (Signed)
    Subjective: Procedure(s) (LRB): TLIFT L5-S1 (N/A) 1 Day Post-Op  Patient reports pain as 2 on 0-10 scale.  Reports decreased leg pain reports incisional back pain   Positive void Negative bowel movement Positive flatus Negative chest pain or shortness of breath  Objective: Vital signs in last 24 hours: Temp:  [97.1 F (36.2 C)-98.5 F (36.9 C)] 97.9 F (36.6 C) (06/26 1610) Pulse Rate:  [78-96] 78 (06/26 0638) Resp:  [7-96] 16 (06/26 0739) BP: (101-134)/(53-73) 107/55 mmHg (06/26 0638) SpO2:  [95 %-100 %] 99 % (06/26 0739) Weight:  [88.905 kg (196 lb)] 88.905 kg (196 lb) (06/25 1300)  Intake/Output from previous day: 06/25 0701 - 06/26 0700 In: 2585 [P.O.:350; I.V.:1985; IV Piggyback:250] Out: 2525 [Urine:2325; Blood:200]  Labs:  Recent Labs  03/26/13 1553  WBC 8.6  RBC 4.37  HCT 39.5  PLT 350    Recent Labs  03/26/13 1553  NA 139  K 3.9  CL 101  CO2 27  BUN 21  CREATININE 1.01  GLUCOSE 104*  CALCIUM 9.5   No results found for this basename: LABPT, INR,  in the last 72 hours  Physical Exam: Neurologically intact ABD soft Neurovascular intact Incision: dressing C/D/I and no drainage Compartment soft  Assessment/Plan: Patient stable  xrays satisfactory Continue mobilization with physical therapy Continue care  Advance diet Up with therapy D/C IV fluids Discharge home with home health vs SNF - depends upon how she progresses.   Venita Lick, MD [facility] [phone]

## 2013-03-29 NOTE — Op Note (Signed)
NAMEMarland Kitchen  DEVANNY, PALECEK NO.:  [id number]  MEDICAL RECORD NO.:  [id number]  LOCATION:  5N01C                        FACILITY:  [facility]  PHYSICIAN:  Alvy Beal, MD    DATE OF BIRTH:  06-Mar-1946  DATE OF PROCEDURE:  03/28/2013 DATE OF DISCHARGE:                              OPERATIVE REPORT   PREOPERATIVE DIAGNOSIS:  Grade 2 isthmic spondylolisthesis with significant left radicular leg radiculopathy.  POSTOPERATIVE DIAGNOSIS:  Grade 2 isthmic spondylolisthesis with significant left radicular leg radiculopathy.  OPERATIVE PROCEDURE:  Gill decompression, left side L5-S1 with posterior spinal fusion, instrumentation L5-S1 with posterolateral arthrodesis, L5- S1 with local bone plus DPX mixture.  COMPLICATIONS:  None.  CONDITION:  Stable.  HISTORY:  This is a very pleasant woman who presented to my office with complaints of significant back, buttock, and left leg pain.  Attempts at conservative management had failed to alleviate her symptoms and so we elected to proceed with surgery.  All appropriate risks, benefits, and alternatives to surgery were discussed.  Initially, I felt as though a T- lift would be the best option.  However, intraoperatively, I noted that there was significant angulation at the L5-S1 slip and there was already a bony arthritic ridge at L5-S1.  As such from a technical standpoint it was very complicated, it would have required an additional significant increased exposure in order to get the appropriate angle.  At that point, I elected to just do the Gill decompression and instrumented fusion.  OPERATIVE NOTE:  The patient was brought to the operating room and placed supine on the operating table.  After successful induction of general anesthesia and endotracheal intubation, TEDs, SCDs, and a Foley were inserted.  The NeuroStim representative placed all appropriate needles for intraoperative neuromonitoring.  The patient was  turned prone onto the Wilson frame.  All bony prominences were well padded. The back was prepped and draped in the standard fashion.  Time-out was taken to confirm the patient procedure and all other pertinent important data.  At this point, x-ray was brought sterilely into the field and I identified the lateral border of the L5 and S1 pedicles bilaterally. These were marked out and these incision sites were infiltrated with 0.25% Marcaine.  Incision was made on the right lateral side of the spine.  I then used the pedicle finder to advance down to the lateral border of the L5 pedicle.  I confirmed position in the AP and lateral planes on fluoroscopy.  I attached the neuromonitoring device to the Jamshidi needle, and then using real time fluoro and EMG neuro monitoring, I advanced the Jamshidi needle into the L5 pedicle and into the L5 vertebral body.  I then placed a guide pin through this and then removed it, and then repeated the procedure at S1.  Once both pedicle screws were cannulated, I then tapped and then placed a 60, 6 mm diameter x 35 mm length at S1 and 40 mm length at L5 pedicle screws.  Both pedicle screws had excellent purchase.  I then identified the L5-S1 facet capsule and decorticated this with a Cobb elevator.  Once this was done, I then stimulated both screws and  I had satisfactory readings indicative of that there was no breach to the pedicle.  I then went on the left-hand side which was the more symptomatic side, and made a similar approach.  At this time, I did a complete Wilsey approach, dissecting down to the deep fascia, incising it and then mobilizing the paraspinal muscles to expose the L5-S1 facet complex.  I then placed the Jamshidi needle on the lateral aspect of the L5-S1 facet complex, took an x-ray, confirmed the position, and then advanced it similar to as I did on the other side, and then placed the guide pin.  I repeated this at the L5 pedicle.   Once both pedicles were cannulated, I then tapped and placed screws.  Both screws stimulated appropriately and there was no evidence of neurodiagnostic of breach.  At this point, I noted that the posterior elements of this side were obviously loose and were not in congruity with the pars.  At this point, I used a double- action Leksell rongeur to do a complete laminectomy on the left side of L5 and then removed the entire inferior facet of the L5.  I then identified.  This was done using double-action Leksell rongeur and Kerrison punch rongeurs.  I then mobilized the ligamentum flavum with a [facility], and then placed a neuro patty between the ligamentum flavum and the thecal sac.  I then used my 2 and 3 mm Kerrison and removed the ligamentum flavum exposing the posterior lateral aspect of the thecal sac.  I then continued my dissection into the lateral gutter until I could palpate and eventually visualize the S1 pedicle.  At this point, I then was able to identify the S1 nerve root.  I traced the nerve root towards the S1 foramen.  I then carried my dissection superiorly removing all the fibrocartilaginous material that had formed in the isthmic pars defect and the remaining portion of the L5 facet complex.  I then identified the L5 pedicle and then identified the L5 nerve root.  Using my 2-mm Kerrison, I dissected until I was above the L5 pedicle and then I traced the L5 nerve root around the L5 pedicle and into the foramen.  I completely removed the pars unroofing the L5 nerve root.  At this point, I had an adequate decompression of the left L5 and S1 nerve roots and the posterior elements, and posterior thecal sac.  I then identified the L5-S1 disk space.  Because of the angulation, it was very difficult to get the proper trajectory with the instrumentation to do the interbody fixation.  At this point, in order to get that I would have to carry my dissection up superiorly to at  least the L3-4 to the 4- 5, and possibly 3-4 levels.  I did not feel as though this was in the patient's best interest.  I felt with the adequate decompression on the symptomatic side, I could do a posterior lateral arthrodesis with local bone and stabilization with the pedicle screws which would give an adequate outcome.  At this point, I then abandoned the interbody fixation portion and then placed bone graft in the posterolateral gutter on the right side.  I then connected rods 2/3 pedicle screw construct and they were torqued down according to manufacture's standards.  At this point, I then checked the L5 and S1 pedicles on the left side. There was no evidence of breach and there was an adequate decompression of the nerve.  I then irrigated the  wound copiously with normal saline, obtained hemostasis using bipolar electrocautery.  I placed a thrombin- soaked Gelfoam patty over the exposed thecal sac and then closed in a layered fashion both wounds with interrupted #1 Vicryl sutures, 0 Vicryl sutures, 2-0 Vicryl sutures, and a 3-0 Monocryl.  Steri-Strips and dry dressing were applied.  At the end of the case, all needle and sponge counts were correct.  The patient was ultimately extubated and transferred to the PACU without incident.     Alvy Beal, MD     DDB/MEDQ  D:  03/28/2013  T:  03/29/2013  Job:  [id number]

## 2013-03-29 NOTE — Progress Notes (Signed)
UR COMPLETED  

## 2013-03-30 ENCOUNTER — Encounter (HOSPITAL_COMMUNITY): Payer: Self-pay | Admitting: General Practice

## 2013-03-30 LAB — GLUCOSE, CAPILLARY
Glucose-Capillary: 117 mg/dL — ABNORMAL HIGH (ref 70–99)
Glucose-Capillary: 105 mg/dL — ABNORMAL HIGH (ref 70–99)
Glucose-Capillary: 122 mg/dL — ABNORMAL HIGH (ref 70–99)

## 2013-03-30 MED ORDER — POLYETHYLENE GLYCOL 3350 17 GM/SCOOP PO POWD: 17.0000 g | Freq: Every day | ORAL | Status: DC

## 2013-03-30 MED ORDER — ONDANSETRON HCL 4 MG PO TABS: 4.0000 mg | ORAL_TABLET | Freq: Three times a day (TID) | ORAL | Status: DC | PRN

## 2013-03-30 MED ORDER — HYDROCODONE-ACETAMINOPHEN 10-325 MG PO TABS: 1.0000 | ORAL_TABLET | Freq: Four times a day (QID) | ORAL | Status: DC | PRN

## 2013-03-30 MED ORDER — DOCUSATE SODIUM 100 MG PO CAPS: 100.0000 mg | ORAL_CAPSULE | Freq: Three times a day (TID) | ORAL | Status: DC | PRN

## 2013-03-30 MED ORDER — METHOCARBAMOL 500 MG PO TABS: 500.0000 mg | ORAL_TABLET | Freq: Three times a day (TID) | ORAL | Status: DC | PRN

## 2013-03-30 NOTE — Discharge Summary (Signed)
Patient ID: Erica Thomas MRN: [id number] DOB/AGE: 09-May-1946 67 y.o.  Admit date: 03/28/2013 Discharge date: 03/30/2013  Admission Diagnoses:  Active Problems:   * No active hospital problems. *   Discharge Diagnoses:  Active Problems:   * No active hospital problems. *  status post Procedure(s): TLIFT L5-S1  Past Medical History  Diagnosis Date  . Hyperlipidemia   . Cataract   . Hypertension   . Diabetes mellitus without complication     control by diet only  . Arthritis     Surgeries: Procedure(s): TLIFT L5-S1 on 03/28/2013   Consultants:  none  Discharged Condition: Improved  Hospital Course: Erica Thomas is an 67 y.o. female who was admitted 03/28/2013 for operative treatment of <principal problem not specified>. Patient failed conservative treatments (please see the history and physical for the specifics) and had severe unremitting pain that affects sleep, daily activities and work/hobbies. After pre-op clearance, the patient was taken to the operating room on 03/28/2013 and underwent  Procedure(s): TLIFT L5-S1.    Patient was given perioperative antibiotics: Anti-infectives   Start     Dose/Rate Route Frequency Ordered Stop   03/29/13 0015  vancomycin (VANCOCIN) IVPB 1000 mg/200 mL premix     1,000 mg 200 mL/hr over 60 Minutes Intravenous Every 12 hours 03/28/13 1739 03/29/13 1307   03/27/13 1430  vancomycin (VANCOCIN) IVPB 1000 mg/200 mL premix     1,000 mg 200 mL/hr over 60 Minutes Intravenous  Once 03/27/13 1422 03/28/13 1215       Patient was given sequential compression devices and early ambulation to prevent DVT.   Patient benefited maximally from hospital stay and there were no complications. At the time of discharge, the patient was urinating/moving their bowels without difficulty, tolerating a regular diet, pain is controlled with oral pain medications and they have been cleared by PT/OT.   Recent vital signs: Patient Vitals for the past 24  hrs:  BP Temp Pulse Resp SpO2  03/30/13 0612 134/55 mmHg 98.7 F (37.1 C) 63 16 100 %  03/29/13 2023 94/57 mmHg 97.5 F (36.4 C) 77 16 96 %  03/29/13 1357 107/52 mmHg 99 F (37.2 C) 80 16 96 %  03/29/13 0739 - - - 16 99 %     Recent laboratory studies: No results found for this basename: WBC, HGB, HCT, PLT, NA, K, CL, CO2, BUN, CREATININE, GLUCOSE, PT, INR, CALCIUM, 2,  in the last 72 hours   Discharge Medications:     Medication List    STOP taking these medications       traMADol 50 MG tablet  Commonly known as:  ULTRAM      TAKE these medications       atorvastatin 40 MG tablet  Commonly known as:  LIPITOR  Take 1 tablet (40 mg total) by mouth daily.     docusate sodium 100 MG capsule  Commonly known as:  COLACE  Take 1 capsule (100 mg total) by mouth 3 (three) times daily as needed for constipation.     HYDROcodone-acetaminophen 10-325 MG per tablet  Commonly known as:  NORCO  Take 1 tablet by mouth every 6 (six) hours as needed for pain.     lisinopril-hydrochlorothiazide 20-25 MG per tablet  Commonly known as:  PRINZIDE,ZESTORETIC  Take 1 tablet by mouth daily.     methocarbamol 500 MG tablet  Commonly known as:  ROBAXIN  Take 1 tablet (500 mg total) by mouth 3 (three) times daily  as needed.     ondansetron 4 MG tablet  Commonly known as:  ZOFRAN  Take 1 tablet (4 mg total) by mouth every 8 (eight) hours as needed for nausea.     polyethylene glycol powder powder  Commonly known as:  GLYCOLAX  Take 17 g by mouth daily.     Vitamin D (Ergocalciferol) 50000 UNITS Caps  Commonly known as:  DRISDOL  TAKE ONE CAPSULE BY MOUTH ONCE A WEEK        Diagnostic Studies: Dg Chest 2 View  03/26/2013   *RADIOLOGY REPORT*  Clinical Data: Preop for lumbar surgery.  CHEST - 2 VIEW  Comparison: None  Findings: Cardiac silhouette, mediastinal and hilar contours are normal.  The lungs are clear.  Minimal linear scarring changes at both lung bases.  No pleural effusion.   The bony thorax is intact.  IMPRESSION: Minimal linear basilar scarring changes.  No infiltrates, edema or effusions.   Original Report Authenticated By: Rudie Meyer, M.D.   Dg Lumbar Spine 2-3 Views  03/29/2013   *RADIOLOGY REPORT*  Clinical Data: Is most spine fusion  LUMBAR SPINE - 2-3 VIEW  Comparison: 03/28/2013  Findings: The patient has undergone screw rod fixation of the L5 S1 level. No adverse features are identified postoperatively. Anterolisthesis of L5 on S1 measures 9 mm.  IMPRESSION: Postoperative appearance as noted above   Original Report Authenticated By: Rhodia Albright, M.D.   Dg Lumbar Spine 2-3 Views  03/28/2013   *RADIOLOGY REPORT*  Clinical Data: 67 year old female undergoing lumbar surgery.  LUMBAR SPINE - 2-3 VIEW, DG C-ARM GT 120 MIN  Comparison: 03/26/2013.  Findings: Frontal and lateral fluoroscopic views of the lumbosacral junction.  Bilateral transpedicular hardware at L5-S1. Spondylolisthesis appears stable.  IMPRESSION: Posterior L5-S1 fusion.   Original Report Authenticated By: Erskine Speed, M.D.   Dg Lumbar Spine 2-3 Views  03/26/2013   *RADIOLOGY REPORT*  Clinical Data: Preop for lumbar surgery.  LUMBAR SPINE - 2-3 VIEW  Comparison: Lumbar spine series 02/08/2013.  Findings: Mild left convex lumbar scoliosis.  Bilateral pars defects at L5 with a grade 1 spondylolisthesis and associated severe degenerative disease and facet disease.  There are five non- rib bearing lumbar type vertebral bodies which are appropriately labeled.  The  IMPRESSION: Five lumbar type non-rib bearing vertebral bodies. Bilateral pars defects and spondylolisthesis at L5.   Original Report Authenticated By: Rudie Meyer, M.D.   Dg C-arm Gt 120 Min  03/28/2013   *RADIOLOGY REPORT*  Clinical Data: 67 year old female undergoing lumbar surgery.  LUMBAR SPINE - 2-3 VIEW, DG C-ARM GT 120 MIN  Comparison: 03/26/2013.  Findings: Frontal and lateral fluoroscopic views of the lumbosacral junction.   Bilateral transpedicular hardware at L5-S1. Spondylolisthesis appears stable.  IMPRESSION: Posterior L5-S1 fusion.   Original Report Authenticated By: Erskine Speed, M.D.          Follow-up Information   Follow up with Alvy Beal, MD. Schedule an appointment as soon as possible for a visit in 2 weeks.   Contact information:   [address] [place] [address] [phone]       Discharge Plan:  discharge to home with HHPT  Disposition: stable    Signed: Venita Lick D for Dr. Venita Lick [facility] [phone] 03/30/2013, 7:31 AM

## 2013-03-30 NOTE — Progress Notes (Signed)
Occupational Therapy Treatment and Discharge Patient Details Name: Erica Thomas MRN: [id number] DOB: 12-04-1945 Today's Date: 03/30/2013 Time: 4098-1191 OT Time Calculation (min): 15 min  OT Assessment / Plan / Recommendation  OT comments  Goal met.  Pt performing ADL and mobility at modified independent level.  Knowledgeable in back precautions.  Follow Up Recommendations  No OT follow up;Supervision - Intermittent    Barriers to Discharge       Equipment Recommendations  3 in 1 bedside comode    Recommendations for Other Services    Frequency Min 2X/week   Progress towards OT Goals Progress towards OT goals: Progressing toward goals  Plan Discharge plan remains appropriate    Precautions / Restrictions Precautions Precautions: Back;Fall Precaution Comments: knowledgeable in back precautions Required Braces or Orthoses: Spinal Brace Spinal Brace: Lumbar corset;Applied in sitting position Restrictions Weight Bearing Restrictions: No   Pertinent Vitals/Pain Surgical incision pain, repositioned, did not rate    ADL  Grooming: Wash/dry hands;Modified independent Where Assessed - Grooming: Unsupported standing Toilet Transfer: Modified independent Toilet Transfer Method: Sit to Barista: Comfort height toilet;Grab bars Toileting - Clothing Manipulation and Hygiene: Modified independent Where Assessed - Engineer, mining and Hygiene: Sit to stand from 3-in-1 or toilet Tub/Shower Transfer: Modified independent Tub/Shower Transfer Method: Science writer:  (pt will stand) Equipment Used: Rolling walker Transfers/Ambulation Related to ADLs: mod independent with RW ADL Comments: Pt generalizes back precautions.  Knowledgable in use of DME and AE.  Problem solved feeding cat.      OT Diagnosis:    OT Problem List:   OT Treatment Interventions:     OT Goals(current goals can now be found in the care plan  section) ADL Goals Pt Will Perform Tub/Shower Transfer: Tub transfer;with modified independence;ambulating  Visit Information  Last OT Received On: 03/30/13 History of Present Illness: s/p TLIFT L5-S1    Subjective Data      Prior Functioning  Home Living Family/patient expects to be discharged to:: Private residence Living Arrangements: Alone    Cognition  Cognition Arousal/Alertness: Awake/alert Behavior During Therapy: WFL for tasks assessed/performed Overall Cognitive Status: Within Functional Limits for tasks assessed    Mobility  Bed Mobility Bed Mobility: Left Sidelying to Sit Left Sidelying to Sit: 6: Modified independent (Device/Increase time) Transfers Sit to Stand: 6: Modified independent (Device/Increase time);With upper extremity assist;From bed;From toilet Stand to Sit: 6: Modified independent (Device/Increase time);With upper extremity assist;To chair/3-in-1;To toilet Details for Transfer Assistance: pt demo good technique and safety awareness; able to adhere to back precautions with transfers    Exercises      Balance     End of Session OT - End of Session Activity Tolerance: Patient tolerated treatment well Patient left: in chair;with call bell/phone within reach  GO     Evern Bio 03/30/2013, 12:01 PM [phone]

## 2013-03-30 NOTE — Care Management Note (Signed)
    Page 1 of 2   04/02/2013     7:52:14 AM   CARE MANAGEMENT NOTE 04/02/2013  Patient:  Erica Thomas, Erica Thomas   Account Number:  [id number]  Date Initiated:  03/30/2013  Documentation initiated by:  [facility]  Subjective/Objective Assessment:   admitted postop L5-S1 TLIF     Action/Plan:   plan return home with HHPT, rolling walker, 3N1   Anticipated DC Date:  03/31/2013   Anticipated DC Plan:  HOME W HOME HEALTH SERVICES      DC Planning Services  CM consult      Choice offered to / List presented to:  C-1 Patient   DME arranged  3-N-1  Levan Hurst      DME agency  [facility]     HH arranged  HH-2 PT      [facility] agency  [facility]   Status of service:  Completed, signed off Medicare Important Message given?   (If response is "NO", the following Medicare IM given date fields will be blank) Date Medicare IM given:   Date Additional Medicare IM given:    Discharge Disposition:  HOME W HOME HEALTH SERVICES  Per UR Regulation:    If discussed at Long Length of Stay Meetings, dates discussed:    Comments:  03/31/13 Per MD note anticipate d/c with HHPT. Spoke with patient about HHC. She chose [facility],contacted Annalysse at [facility] and set up HHPT. Contacted Darian with [facility] and requested rolling walker and 3N1. Jacquelynn Cree RN, BSN, CCM

## 2013-03-30 NOTE — Progress Notes (Signed)
[facility]  Patient Status: New  [facility] is providing the following services: PT - note plan for discharge on Saturday.  Is on the schedule for a PT visit at home on Sunday or Monday.  Thanks!  If patient discharges after hours, please call [phone].   Erica Thomas 03/30/2013, 12:40 PM

## 2013-03-30 NOTE — Progress Notes (Signed)
    Subjective: Procedure(s) (LRB): TLIFT L5-S1 (N/A) 2 Days Post-Op  Patient reports pain as 3 on 0-10 scale.  Reports decreased leg pain reports incisional back pain   Positive void Negative bowel movement Positive flatus Negative chest pain or shortness of breath  Objective: Vital signs in last 24 hours: Temp:  [97.5 F (36.4 C)-99 F (37.2 C)] 98.7 F (37.1 C) (06/27 0612) Pulse Rate:  [63-80] 63 (06/27 0612) Resp:  [16] 16 (06/27 0612) BP: (94-134)/(52-57) 134/55 mmHg (06/27 0612) SpO2:  [96 %-100 %] 100 % (06/27 0612)  Intake/Output from previous day: 06/26 0701 - 06/27 0700 In: 960 [P.O.:720] Out: -   Labs: No results found for this basename: WBC, RBC, HCT, PLT,  in the last 72 hours No results found for this basename: NA, K, CL, CO2, BUN, CREATININE, GLUCOSE, CALCIUM,  in the last 72 hours No results found for this basename: LABPT, INR,  in the last 72 hours  Physical Exam: Neurologically intact ABD soft Neurovascular intact Intact pulses distally Incision: dressing C/D/I and no drainage Compartment soft  Assessment/Plan: Patient stable  xrays satisfactory Continue mobilization with physical therapy Continue care  Up with therapy Plan on d/c to home Saturday with HHPT BP has be running low - patient asymptomatic.  Will hold hypertensive meds and monitor  Venita Lick, MD [facility] [phone]

## 2013-03-31 LAB — GLUCOSE, CAPILLARY: Glucose-Capillary: 109 mg/dL — ABNORMAL HIGH (ref 70–99)

## 2013-03-31 NOTE — Progress Notes (Signed)
Erica Thomas  MRN: [id number] DOB/Age: 1946-08-01 67 y.o. Physician: Jacquelyne Balint Procedure: Procedure(s) (LRB): TLIFT L5-S1 (N/A)     Subjective: Doing well, has met DC goals. No leg pain, and very happy overall  Vital Signs Temp:  [99 F (37.2 C)-99.1 F (37.3 C)] 99 F (37.2 C) (06/28 0549) Pulse Rate:  [87-96] 87 (06/28 0549) Resp:  [16] 16 (06/28 0549) BP: (135-139)/(61-66) 135/62 mmHg (06/28 0549) SpO2:  [94 %-98 %] 98 % (06/28 0549)  Lab Results No results found for this basename: WBC, HGB, HCT, PLT,  in the last 72 hours BMET No results found for this basename: NA, K, CL, CO2, GLUCOSE, BUN, CREATININE, CALCIUM,  in the last 72 hours No results found for this basename: inr     Exam Lumbar dressing mepilex in place without any drainage. Discussed with patient to leave in place until shower as it is sterile under Moving legs well        Plan DC home today Plan per DC instructions.  FU with Dr. Shon Baton in 2 weeks  [facility] for Dr.Kevin Supple 03/31/2013, 9:56 AM

## 2013-05-09 ENCOUNTER — Other Ambulatory Visit: Payer: Self-pay

## 2013-05-23 ENCOUNTER — Other Ambulatory Visit: Payer: Self-pay | Admitting: Family Medicine

## 2013-06-26 DIAGNOSIS — Z981 Arthrodesis status: Secondary | ICD-10-CM | POA: Diagnosis not present

## 2013-07-26 DIAGNOSIS — Z23 Encounter for immunization: Secondary | ICD-10-CM | POA: Diagnosis not present

## 2013-08-01 ENCOUNTER — Encounter: Payer: Self-pay | Admitting: *Deleted

## 2013-08-23 ENCOUNTER — Other Ambulatory Visit: Payer: Self-pay | Admitting: Physician Assistant

## 2013-09-18 ENCOUNTER — Ambulatory Visit (INDEPENDENT_AMBULATORY_CARE_PROVIDER_SITE_OTHER): Payer: BC Managed Care – PPO | Admitting: Family Medicine

## 2013-09-18 ENCOUNTER — Encounter: Payer: Self-pay | Admitting: Family Medicine

## 2013-09-18 VITALS — BP 148/80 | HR 96 | Temp 98.1°F | Resp 16 | Ht 62.0 in | Wt 206.0 lb

## 2013-09-18 DIAGNOSIS — E785 Hyperlipidemia, unspecified: Secondary | ICD-10-CM

## 2013-09-18 DIAGNOSIS — I10 Essential (primary) hypertension: Secondary | ICD-10-CM | POA: Diagnosis not present

## 2013-09-18 MED ORDER — LISINOPRIL-HYDROCHLOROTHIAZIDE 20-25 MG PO TABS: ORAL_TABLET | ORAL | Status: DC

## 2013-09-18 MED ORDER — ATORVASTATIN CALCIUM 40 MG PO TABS: ORAL_TABLET | ORAL | Status: DC

## 2013-09-18 NOTE — Progress Notes (Signed)
S:  This 67 y.o. Cauc female has well controlled HTN; she is here for recheck after undergoing lumbar spine surgery by Dr. Shon Baton in June 2014. She is pain-free and feels good. She is compliant w/ medications w/o adverse effects. Pt denies fatigue, diaphoresis, CP or tightness, palpitations, SOB or DOE, cough, edema, HA, dizziness, weakness, numbness or syncope. She c/o weight gain during the holidays.  Patient Active Problem List   Diagnosis Date Noted  . DDD (degenerative disc disease), lumbar 02/08/2013  . HTN, goal below 140/80 09/21/2012  . Limb pain 09/21/2012  . Hyperlipidemia 09/21/2012  . Type II or unspecified type diabetes mellitus without mention of complication, not stated as uncontrolled 09/21/2012   PMHx, Surg Hx , Soc Hx reviewed. Medications reconciled.  ROS: As per HPI.  O: Filed Vitals:   09/18/13 1552  BP: 148/80  Pulse: 96  Temp: 98.1 F (36.7 C)  Resp: 16   GEN: In NAD; WN,WD. HENT: [place]/AT; EOMI w/ clear conj/sclerae. Otherwise unremarkable. COR: RRR. LUNGS: Normal resp rate and effort. SKIN: W&D; intact w/o diaphoresis, erythema or pallor. NEURO: A&O x 3; CNs intact. Gait normal. Nonfocal.  A/P: HTN, goal below 140/80- Stable on current medications; continue same.  Hyperlipidemia- Taking Atorvastatin w/o side effects.   Meds ordered this encounter  Medications  . atorvastatin (LIPITOR) 40 MG tablet    Sig: TAKE 1 TABLET BY MOUTH ONCE DAILY    Dispense:  30 tablet    Refill:  11  . lisinopril-hydrochlorothiazide (PRINZIDE,ZESTORETIC) 20-25 MG per tablet    Sig: TAKE 1 TABLET BY MOUTH DAILY.    Dispense:  30 tablet    Refill:  11

## 2013-09-18 NOTE — Patient Instructions (Signed)
Vitamin D Deficiency Vitamin D is an important vitamin that your body needs. Having too little of it in your body is called a deficiency. A very bad deficiency can make your bones soft and can cause a condition called rickets.  Vitamin D is important to your body for different reasons, such as:   It helps your body absorb 2 minerals called calcium and phosphorus.  It helps make your bones healthy.  It may prevent some diseases, such as diabetes and multiple sclerosis.  It helps your muscles and heart. You can get vitamin D in several ways. It is a natural part of some foods. The vitamin is also added to some dairy products and cereals. Some people take vitamin D supplements. Also, your body makes vitamin D when you are in the sun. It changes the sun's rays into a form of the vitamin that your body can use. CAUSES   Not eating enough foods that contain vitamin D.  Not getting enough sunlight.  Having certain digestive system diseases that make it hard to absorb vitamin D. These diseases include Crohn's disease, chronic pancreatitis, and cystic fibrosis.  Having a surgery in which part of the stomach or small intestine is removed.  Being obese. Fat cells pull vitamin D out of your blood. That means that obese people may not have enough vitamin D left in their blood and in other body tissues.  Having chronic kidney or liver disease. RISK FACTORS Risk factors are things that make you more likely to develop a vitamin D deficiency. They include:  Being older.  Not being able to get outside very much.  Living in a nursing home.  Having had broken bones.  Having weak or thin bones (osteoporosis).  Having a disease or condition that changes how your body absorbs vitamin D.  Having dark skin.  Some medicines such as seizure medicines or steroids.  Being overweight or obese. SYMPTOMS Mild cases of vitamin D deficiency may not have any symptoms. If you have a very bad case, symptoms  may include:  Bone pain.  Muscle pain.  Falling often.  Broken bones caused by a minor injury, due to osteoporosis. DIAGNOSIS A blood test is the best way to tell if you have a vitamin D deficiency. TREATMENT Vitamin D deficiency can be treated in different ways. Treatment for vitamin D deficiency depends on what is causing it. Options include:  Taking vitamin D supplements.  Taking a calcium supplement. Your caregiver will suggest what dose is best for you. HOME CARE INSTRUCTIONS  Take any supplements that your caregiver prescribes. Follow the directions carefully. Take only the suggested amount.  Have your blood tested 2 months after you start taking supplements.  Eat foods that contain vitamin D. Healthy choices include:  Fortified dairy products, cereals, or juices. Fortified means vitamin D has been added to the food. Check the label on the package to be sure.  Fatty fish like salmon or trout.  Eggs.  Oysters.  Do not use a tanning bed.  Keep your weight at a healthy level. Lose weight if you need to.  Keep all follow-up appointments. Your caregiver will need to perform blood tests to make sure your vitamin D deficiency is going away. SEEK MEDICAL CARE IF:  You have any questions about your treatment.  You continue to have symptoms of vitamin D deficiency.  You have nausea or vomiting.  You are constipated.  You feel confused.  You have severe abdominal or back pain. MAKE   SURE YOU:  Understand these instructions.  Will watch your condition.  Will get help right away if you are not doing well or get worse. Document Released: 12/13/2011 Document Revised: 01/15/2013 Document Reviewed: 12/13/2011 [facility] Patient Information 2014 [place], [place].    Get over-the-counter Citracal + D or Oscal + D and take 1 tablet twice a day. Try to include Vitamin-D rich foods in your diet and it is important to get some sun exposure most days of the week, weather  permitting.

## 2013-09-22 ENCOUNTER — Other Ambulatory Visit: Payer: Self-pay | Admitting: Physician Assistant

## 2013-10-09 ENCOUNTER — Telehealth: Payer: Self-pay

## 2013-10-09 MED ORDER — LISINOPRIL-HYDROCHLOROTHIAZIDE 20-25 MG PO TABS: ORAL_TABLET | ORAL | Status: DC

## 2013-10-09 MED ORDER — ATORVASTATIN CALCIUM 40 MG PO TABS: ORAL_TABLET | ORAL | Status: DC

## 2013-10-09 NOTE — Telephone Encounter (Signed)
VM asking for pt's rxs to be changed to 90 day supplies w/3 RFs rather than 30 w/11 RFs. Verified w/pt that she does need these sent to mail order now and am re-sending for 90 days.

## 2014-01-10 ENCOUNTER — Other Ambulatory Visit: Payer: Self-pay

## 2014-03-19 ENCOUNTER — Encounter: Payer: BC Managed Care – PPO | Admitting: Family Medicine

## 2014-05-29 ENCOUNTER — Encounter: Payer: Self-pay | Admitting: Family Medicine

## 2014-05-29 ENCOUNTER — Ambulatory Visit (INDEPENDENT_AMBULATORY_CARE_PROVIDER_SITE_OTHER): Payer: Medicare Other | Admitting: Family Medicine

## 2014-05-29 ENCOUNTER — Other Ambulatory Visit: Payer: Self-pay | Admitting: Radiology

## 2014-05-29 VITALS — BP 130/82 | HR 88 | Temp 98.7°F | Resp 16 | Ht 62.0 in | Wt 211.8 lb

## 2014-05-29 DIAGNOSIS — Z0000 Encounter for general adult medical examination without abnormal findings: Secondary | ICD-10-CM

## 2014-05-29 DIAGNOSIS — E119 Type 2 diabetes mellitus without complications: Secondary | ICD-10-CM | POA: Diagnosis not present

## 2014-05-29 DIAGNOSIS — E782 Mixed hyperlipidemia: Secondary | ICD-10-CM | POA: Diagnosis not present

## 2014-05-29 DIAGNOSIS — Z124 Encounter for screening for malignant neoplasm of cervix: Secondary | ICD-10-CM | POA: Diagnosis not present

## 2014-05-29 DIAGNOSIS — Z1211 Encounter for screening for malignant neoplasm of colon: Secondary | ICD-10-CM

## 2014-05-29 DIAGNOSIS — Z01419 Encounter for gynecological examination (general) (routine) without abnormal findings: Secondary | ICD-10-CM

## 2014-05-29 DIAGNOSIS — R7309 Other abnormal glucose: Secondary | ICD-10-CM | POA: Diagnosis not present

## 2014-05-29 DIAGNOSIS — H269 Unspecified cataract: Secondary | ICD-10-CM | POA: Diagnosis not present

## 2014-05-29 DIAGNOSIS — Z1231 Encounter for screening mammogram for malignant neoplasm of breast: Secondary | ICD-10-CM

## 2014-05-29 LAB — COMPLETE METABOLIC PANEL WITH GFR
ALBUMIN: 4.1 g/dL (ref 3.5–5.2)
ALT: 15 U/L (ref 0–35)
AST: 15 U/L (ref 0–37)
Alkaline Phosphatase: 124 U/L — ABNORMAL HIGH (ref 39–117)
BUN: 23 mg/dL (ref 6–23)
CALCIUM: 9.1 mg/dL (ref 8.4–10.5)
CHLORIDE: 102 meq/L (ref 96–112)
CO2: 27 meq/L (ref 19–32)
Creat: 0.96 mg/dL (ref 0.50–1.10)
GFR, EST AFRICAN AMERICAN: 69 mL/min
GFR, EST NON AFRICAN AMERICAN: 60 mL/min
GLUCOSE: 136 mg/dL — AB (ref 70–99)
POTASSIUM: 4.3 meq/L (ref 3.5–5.3)
Sodium: 139 mEq/L (ref 135–145)
TOTAL PROTEIN: 6.7 g/dL (ref 6.0–8.3)
Total Bilirubin: 0.8 mg/dL (ref 0.2–1.2)

## 2014-05-29 LAB — POCT URINALYSIS DIPSTICK
Bilirubin, UA: NEGATIVE
Blood, UA: NEGATIVE
Glucose, UA: NEGATIVE
KETONES UA: NEGATIVE
LEUKOCYTES UA: NEGATIVE
NITRITE UA: NEGATIVE
PROTEIN UA: NEGATIVE
Spec Grav, UA: <=1.005
UROBILINOGEN UA: 0.2
pH, UA: 5.5

## 2014-05-29 LAB — LIPID PANEL
CHOL/HDL RATIO: 3.3 ratio
CHOLESTEROL: 160 mg/dL (ref 0–200)
HDL: 48 mg/dL (ref >39)
LDL Cholesterol: 86 mg/dL (ref 0–99)
Triglycerides: 132 mg/dL (ref <150)
VLDL: 26 mg/dL (ref 0–40)

## 2014-05-29 LAB — POCT GLYCOSYLATED HEMOGLOBIN (HGB A1C): HEMOGLOBIN A1C: 7.8

## 2014-05-29 MED ORDER — LISINOPRIL-HYDROCHLOROTHIAZIDE 20-25 MG PO TABS: ORAL_TABLET | ORAL | Status: DC

## 2014-05-29 MED ORDER — ATORVASTATIN CALCIUM 40 MG PO TABS: ORAL_TABLET | ORAL | Status: DC

## 2014-05-29 MED ORDER — GLUCOSE BLOOD VI STRP: ORAL_STRIP | Status: DC

## 2014-05-29 MED ORDER — LANCETS MISC: Status: DC

## 2014-05-29 MED ORDER — GLUCOLAB GLUCOSE MONITORING KIT: PACK | Status: DC

## 2014-05-29 MED ORDER — METFORMIN HCL 500 MG PO TABS: ORAL_TABLET | ORAL | Status: DC

## 2014-05-29 NOTE — Progress Notes (Signed)
Subjective:    Patient ID: Erica Thomas, female    DOB: [birth year], 68 y.o.   MRN: [id number]  HPI This 68 y.o. Cauc female is here for Subsequent [facility] Annual CPE. She has HTN and a lipid disorder; compliance with medications is very good w/o adverse effects. Pt requests referral to ophthalmology specialist; she has been advised by her optometrist that she has cataracts. She also needs a mammogram. Last PAP was > 30 years; pt is s/p TAH for abnormal PAPs.  HCM: CRS- Current (2008- normal).           IMM- Current.   Patient Active Problem List   Diagnosis Date Noted  . DDD (degenerative disc disease), lumbar 02/08/2013  . HTN, goal below 140/80 09/21/2012  . Limb pain 09/21/2012  . Hyperlipidemia 09/21/2012  . Type II or unspecified type diabetes mellitus without mention of complication, not stated as uncontrolled 09/21/2012    Prior to Admission medications   Medication Sig Start Date End Date Taking? Authorizing Provider  atorvastatin (LIPITOR) 40 MG tablet TAKE 1 TABLET BY MOUTH ONCE DAILY   Yes Barton Fanny, MD  lisinopril-hydrochlorothiazide (PRINZIDE,ZESTORETIC) 20-25 MG per tablet TAKE 1 TABLET BY MOUTH DAILY.   Yes Barton Fanny, MD  Vitamin D, Ergocalciferol, (DRISDOL) 50000 UNITS CAPS capsule TAKE ONE CAPSULE BY MOUTH ONCE A WEEK 08/23/13   Barton Fanny, MD    History   Social History  . Marital Status: Divorced    Spouse Name: N/A    Number of Children: N/A  . Years of Education: N/A   Occupational History  . Retired    Social History Main Topics  . Smoking status: Never Smoker   . Smokeless tobacco: Never Used  . Alcohol Use: No  . Drug Use: No  . Sexual Activity: Not on file   Other Topics Concern  . Not on file   Social History Narrative   Divorced. Education: [employer]. Exercise: No.    Family History  Problem Relation Age of Onset  . Stroke Mother   . Diabetes Mother   . Heart disease Mother   . Hyperlipidemia Mother   .  Heart disease Father   . Cancer Sister     Ovarian and stomach cancer  . Diabetes Maternal Grandmother   . Heart disease Maternal Grandmother   . Cancer Maternal Grandfather   . Arthritis Paternal Grandmother     Review of Systems  Constitutional: Negative.   HENT: Positive for congestion and postnasal drip.        Mild allergic rhinitis but not significant enough for medication.  Eyes: Positive for discharge.  Respiratory: Positive for wheezing.        Pt has never smoked but had passive smoke exposure as a child.  Cardiovascular: Negative.   Gastrointestinal: Negative.   Endocrine: Negative.   Genitourinary: Negative.   Musculoskeletal: Positive for arthralgias and joint swelling.       L knee discomfort but no pain, swelling or pain w/ climbing stairs. Pain does not interrupt sleep.  Skin: Negative.   Allergic/Immunologic: Positive for environmental allergies.  Neurological: Negative.   Hematological: Negative.   Psychiatric/Behavioral: Negative.       Objective:   Physical Exam  Nursing note and vitals reviewed. Constitutional: She is oriented to person, place, and time. Vital signs are normal. She appears well-developed and well-nourished. No distress.  HENT:  Head: Normocephalic and atraumatic.  Right Ear: Hearing, tympanic membrane, external ear and ear canal  normal.  Left Ear: Hearing, tympanic membrane, external ear and ear canal normal.  Nose: Nose normal. No nasal deformity or septal deviation.  Mouth/Throat: Uvula is midline, oropharynx is clear and moist and mucous membranes are normal. No lacerations or dental caries.  Has a partial plate.  Eyes: Conjunctivae and lids are normal. Pupils are equal, round, and reactive to light. No scleral icterus.  Neck: Trachea normal, normal range of motion and full passive range of motion without pain. Neck supple. No JVD present. No spinous process tenderness and no muscular tenderness present. Carotid bruit is not present.  No mass and no thyromegaly present.  Cardiovascular: Normal rate, regular rhythm, S2 normal, normal heart sounds, intact distal pulses and normal pulses.   No extrasystoles are present. PMI is not displaced.  Exam reveals no gallop and no friction rub.   No murmur heard. Pulmonary/Chest: Effort normal and breath sounds normal. She has no decreased breath sounds. She has no wheezes. Right breast exhibits no inverted nipple, no mass, no nipple discharge, no skin change and no tenderness. Left breast exhibits no inverted nipple, no mass, no nipple discharge, no skin change and no tenderness. Breasts are symmetrical.  Abdominal: Soft. Normal appearance and bowel sounds are normal. She exhibits no distension, no abdominal bruit and no pulsatile midline mass. There is no hepatosplenomegaly. There is no tenderness. There is no guarding and no CVA tenderness. No hernia.  Genitourinary: Rectum normal and vagina normal. There is no rash, tenderness or lesion on the right labia. There is no rash, tenderness or lesion on the left labia. Right adnexum displays no mass, no tenderness and no fullness. Left adnexum displays no mass, no tenderness and no fullness.  Cervix and uterus absent.  Musculoskeletal:       Right knee: Normal.       Left knee: She exhibits decreased range of motion. She exhibits no swelling, no effusion, no deformity, normal alignment and no bony tenderness. No tenderness found.       Cervical back: Normal.       Thoracic back: Normal.       Lumbar back: Normal.  Remainder of exam unremarkable.  Lymphadenopathy:       Head (right side): No submental, no submandibular, no tonsillar, no preauricular, no posterior auricular and no occipital adenopathy present.       Head (left side): No submental, no submandibular, no tonsillar, no preauricular, no posterior auricular and no occipital adenopathy present.    She has no cervical adenopathy.    She has no axillary adenopathy.       Right: No  inguinal and no supraclavicular adenopathy present.       Left: No inguinal and no supraclavicular adenopathy present.  Neurological: She is alert and oriented to person, place, and time. She has normal strength. She displays no atrophy and no tremor. No cranial nerve deficit or sensory deficit. She exhibits normal muscle tone. She displays a negative Romberg sign. Coordination and gait normal.  Reflex Scores:      Tricep reflexes are 2+ on the right side and 2+ on the left side.      Bicep reflexes are 2+ on the right side and 2+ on the left side.      Brachioradialis reflexes are 2+ on the right side and 2+ on the left side.      Patellar reflexes are 2+ on the right side and 2+ on the left side. Skin: Skin is warm, dry and intact. No  ecchymosis, no lesion and no rash noted. She is not diaphoretic. No cyanosis or erythema. No pallor. Nails show no clubbing.  Psychiatric: She has a normal mood and affect. Her speech is normal and behavior is normal. Judgment and thought content normal. Cognition and memory are normal.    Results for orders placed in visit on 05/29/14  POCT GLYCOSYLATED HEMOGLOBIN (HGB A1C)      Result Value Ref Range   Hemoglobin A1C 7.8    POCT URINALYSIS DIPSTICK      Result Value Ref Range   Color, UA yellow     Clarity, UA clear     Glucose, UA neg     Bilirubin, UA neg     Ketones, UA neg     Spec Grav, UA <=1.005     Blood, UA neg     pH, UA 5.5     Protein, UA neg     Urobilinogen, UA 0.2     Nitrite, UA neg     Leukocytes, UA Negative        Assessment & Plan:  Routine general medical examination at a health care facility - Plan: POCT urinalysis dipstick  Encounter for cervical Pap smear with pelvic exam - Plan: Pap IG (Image Guided)  Type II or unspecified type diabetes mellitus without mention of complication, not stated as uncontrolled - Start Metformin 500 mg 1 tablet daily with evening meal. Continue healthy lifestyle changes. Encouraged pt to  consider aquatic exercise program. Plan: POCT glycosylated hemoglobin (Hb A1C), COMPLETE METABOLIC PANEL WITH GFR, For home use only DME Glucometer  Cataracts, bilateral - Plan: Ambulatory referral to Ophthalmology  Other screening mammogram - Plan: MM Digital Screening  Mixed hyperlipidemia - Continue  Atorvastatin 40 mg 1 tablet daily. Plan: Lipid panel  Screening for colon cancer - Plan: IFOBT POC (occult bld, rslt in office)  Meds ordered this encounter  . atorvastatin (LIPITOR) 40 MG tablet    Sig: TAKE 1 TABLET BY MOUTH ONCE DAILY    Dispense:  30 tablet    Refill:  11  . lisinopril-hydrochlorothiazide (PRINZIDE,ZESTORETIC) 20-25 MG per tablet    Sig: TAKE 1 TABLET BY MOUTH DAILY.    Dispense:  30 tablet    Refill:  11  . metFORMIN (GLUCOPHAGE) 500 MG tablet    Sig: Take 1 tablet daily with evening meal.    Dispense:  90 tablet    Refill:  3

## 2014-05-29 NOTE — Patient Instructions (Signed)
Keeping You Healthy  Get These Tests  Blood Pressure- Have your blood pressure checked by your healthcare provider at least once a year.  Normal blood pressure is 120/80.  Weight- Have your body mass index (BMI) calculated to screen for obesity.  BMI is a measure of body fat based on height and weight.  You can calculate your own BMI at [employer]  Cholesterol- Have your cholesterol checked every year.  Diabetes- Have your blood sugar checked every year if you have high blood pressure, high cholesterol, a family history of diabetes or if you are overweight.  Pap Smear- Have a pap smear every 1 to 3 years if you have been sexually active.  If you are older than 65 and recent pap smears have been normal you may not need additional pap smears.  In addition, if you have had a hysterectomy  For benign disease additional pap smears are not necessary.  Mammogram-Yearly mammograms are essential for early detection of breast cancer  Screening for Colon Cancer- Colonoscopy starting at age 38. Screening may begin sooner depending on your family history and other health conditions.  Follow up colonoscopy as directed by your Gastroenterologist.  Screening for Osteoporosis- Screening begins at age 46 with bone density scanning, sooner if you are at higher risk for developing Osteoporosis.  Get these medicines  Calcium with Vitamin D- Your body requires 1200-1500 mg of Calcium a day and [phone] IU of Vitamin D a day.  You can only absorb 500 mg of Calcium at a time therefore Calcium must be taken in 2 or 3 separate doses throughout the day.  Hormones- Hormone therapy has been associated with increased risk for certain cancers and heart disease.  Talk to your healthcare provider about if you need relief from menopausal symptoms.  Aspirin- Ask your healthcare provider about taking Aspirin to prevent Heart Disease and Stroke.  Get these Immuniztions  Flu shot- Every fall  Pneumonia  shot- Once after the age of 52; if you are younger ask your healthcare provider if you need a pneumonia shot.  Tetanus- Every ten years.  Zostavax- Once after the age of 64 to prevent shingles.  Take these steps  Don't smoke- Your healthcare provider can help you quit. For tips on how to quit, ask your healthcare provider or go to www.smokefree.gov or call 1-800 QUIT-NOW.  Be physically active- Exercise 5 days a week for a minimum of 30 minutes.  If you are not already physically active, start slow and gradually work up to 30 minutes of moderate physical activity.  Try walking, dancing, bike riding, swimming, etc.  Eat a healthy diet- Eat a variety of healthy foods such as fruits, vegetables, whole grains, low fat milk, low fat cheeses, yogurt, lean meats, chicken, fish, eggs, dried beans, tofu, etc.  For more information go to www.thenutritionsource.org  Dental visit- Brush and floss teeth twice daily; visit your dentist twice a year.  Eye exam- Visit your Optometrist or Ophthalmologist yearly.  Drink alcohol in moderation- Limit alcohol intake to one drink or less a day.  Never drink and drive.  Depression- Your emotional health is as important as your physical health.  If you're feeling down or losing interest in things you normally enjoy, please talk to your healthcare provider.  Seat Belts- can save your life; always wear one  Smoke/Carbon Monoxide detectors- These detectors need to be installed on the appropriate level of your home.  Replace batteries at least once a year.  Violence- If anyone  is threatening or hurting you, please tell your healthcare provider.  Living Will/ Health care power of attorney- Discuss with your healthcare provider and family.     Diabetes and Standards of Medical Care Diabetes is complicated. You may find that your diabetes team includes a dietitian, nurse, diabetes educator, eye doctor, and more. To help everyone know what is going on and to help  you get the care you deserve, the following schedule of care was developed to help keep you on track. Below are the tests, exams, vaccines, medicines, education, and plans you will need. HbA1c test This test shows how well you have controlled your glucose over the past 2-3 months. It is used to see if your diabetes management plan needs to be adjusted.   It is performed at least 2 times a year if you are meeting treatment goals.  It is performed 4 times a year if therapy has changed or if you are not meeting treatment goals. Blood pressure test  This test is performed at every routine medical visit. The goal is less than 140/90 mm Hg for most people, but 130/80 mm Hg in some cases. Ask your health care provider about your goal. Dental exam  Follow up with the dentist regularly. Eye exam  If you are diagnosed with type 1 diabetes as a child, get an exam upon reaching the age of 10 years or older and have had diabetes for 3-5 years. Yearly eye exams are recommended after that initial eye exam.  If you are diagnosed with type 1 diabetes as an adult, get an exam within 5 years of diagnosis and then yearly.  If you are diagnosed with type 2 diabetes, get an exam as soon as possible after the diagnosis and then yearly. Foot care exam  Visual foot exams are performed at every routine medical visit. The exams check for cuts, injuries, or other problems with the feet.  A comprehensive foot exam should be done yearly. This includes visual inspection as well as assessing foot pulses and testing for loss of sensation.  Check your feet nightly for cuts, injuries, or other problems with your feet. Tell your health care provider if anything is not healing. Kidney function test (urine microalbumin)  This test is performed once a year.  Type 1 diabetes: The first test is performed 5 years after diagnosis.  Type 2 diabetes: The first test is performed at the time of diagnosis.  A serum creatinine  and estimated glomerular filtration rate (eGFR) test is done once a year to assess the level of chronic kidney disease (CKD), if present. Lipid profile (cholesterol, HDL, LDL, triglycerides)  Performed every 5 years for most people.  The goal for LDL is less than 100 mg/dL. If you are at high risk, the goal is less than 70 mg/dL.  The goal for HDL is 40 mg/dL-50 mg/dL for men and 50 PB/AQ-56 mg/dL for women. An HDL cholesterol of 60 mg/dL or higher gives some protection against heart disease.  The goal for triglycerides is less than 150 mg/dL. Influenza vaccine, pneumococcal vaccine, and hepatitis B vaccine  The influenza vaccine is recommended yearly.  It is recommended that people with diabetes who are over 75 years old get the pneumonia vaccine. In some cases, two separate shots may be given. Ask your health care provider if your pneumonia vaccination is up to date.  The hepatitis B vaccine is also recommended for adults with diabetes. Diabetes self-management education  Education is recommended at diagnosis  and ongoing as needed. Treatment plan  Your treatment plan is reviewed at every medical visit. Document Released: 07/18/2009 Document Revised: 02/04/2014 Document Reviewed: 02/20/2013 [facility] Patient Information 2015 [place], [place]. This information is not intended to replace advice given to you by your health care provider. Make sure you discuss any questions you have with your health care provider.        Mediterranean Diet  Why follow it? Research shows.   Those who follow the Mediterranean diet have a reduced risk of heart disease    The diet is associated with a reduced incidence of Parkinson's and Alzheimer's diseases   People following the diet may have longer life expectancies and lower rates of chronic diseases    The Dietary Guidelines for Americans recommends the Mediterranean diet as an eating plan to promote health and prevent disease  What Is the  Mediterranean Diet?    Healthy eating plan based on typical foods and recipes of Mediterranean-style cooking   The diet is primarily a plant based diet; these foods should make up a majority of meals   Starches - Plant based foods should make up a majority of meals - They are an important sources of vitamins, minerals, energy, antioxidants, and fiber - Choose whole grains, foods high in fiber and minimally processed items  - Typical grain sources include wheat, oats, barley, corn, brown rice, bulgar, farro, millet, polenta, couscous  - Various types of beans include chickpeas, lentils, fava beans, black beans, white beans   Fruits  Veggies - Large quantities of antioxidant rich fruits & veggies; 6 or more servings  - Vegetables can be eaten raw or lightly drizzled with oil and cooked  - Vegetables common to the traditional Mediterranean Diet include: artichokes, arugula, beets, broccoli, brussel sprouts, cabbage, carrots, celery, collard greens, cucumbers, eggplant, kale, leeks, lemons, lettuce, mushrooms, okra, onions, peas, peppers, potatoes, pumpkin, radishes, rutabaga, shallots, spinach, sweet potatoes, turnips, zucchini - Fruits common to the Mediterranean Diet include: apples, apricots, avocados, cherries, clementines, dates, figs, grapefruits, grapes, melons, nectarines, oranges, peaches, pears, pomegranates, strawberries, tangerines  Fats - Replace butter and margarine with healthy oils, such as olive oil, canola oil, and tahini  - Limit nuts to no more than a handful a day  - Nuts include walnuts, almonds, pecans, pistachios, pine nuts  - Limit or avoid candied, honey roasted or heavily salted nuts - Olives are central to the [employer] - can be eaten whole or used in a variety of dishes   Meats Protein - Limiting red meat: no more than a few times a month - When eating red meat: choose lean cuts and keep the portion to the size of deck of cards - Eggs: approx. 0 to 4 times a  week  - Fish and lean poultry: at least 2 a week  - Healthy protein sources include, chicken, [place], lean beef, lamb - Increase intake of seafood such as tuna, salmon, trout, mackerel, shrimp, scallops - Avoid or limit high fat processed meats such as sausage and bacon  Dairy - Include moderate amounts of low fat dairy products  - Focus on healthy dairy such as fat free yogurt, skim milk, low or reduced fat cheese - Limit dairy products higher in fat such as whole or 2% milk, cheese, ice cream  Alcohol - Moderate amounts of red wine is ok  - No more than 5 oz daily for women (all ages) and men older than age 37  - No more than 10  oz of wine daily for men younger than 57  Other - Limit sweets and other desserts  - Use herbs and spices instead of salt to flavor foods  - Herbs and spices common to the traditional Mediterranean Diet include: basil, bay leaves, chives, cloves, cumin, fennel, garlic, lavender, marjoram, mint, oregano, parsley, pepper, rosemary, sage, savory, sumac, tarragon, thyme   It's not just a diet, it's a lifestyle:    The Mediterranean diet includes lifestyle factors typical of those in the region    Foods, drinks and meals are best eaten with others and savored   Daily physical activity is important for overall good health   This could be strenuous exercise like running and aerobics   This could also be more leisurely activities such as walking, housework, yard-work, or taking the stairs   Moderation is the key; a balanced and healthy diet accommodates most foods and drinks   Consider portion sizes and frequency of consumption of certain foods   Meal Ideas & Options:    Breakfast:  o Whole wheat toast or whole wheat English muffins with peanut butter & hard boiled egg o Steel cut oats topped with apples & cinnamon and skim milk  o Fresh fruit: banana, strawberries, melon, berries, peaches  o Smoothies: strawberries, bananas, greek yogurt, peanut butter o Low fat  greek yogurt with blueberries and granola  o Egg white omelet with spinach and mushrooms o Breakfast couscous: whole wheat couscous, apricots, skim milk, cranberries    Sandwiches:  o Hummus and grilled vegetables (peppers, zucchini, squash) on whole wheat bread   o Grilled chicken on whole wheat pita with lettuce, tomatoes, cucumbers or tzatziki  o Tuna salad on whole wheat bread: tuna salad made with greek yogurt, olives, red peppers, capers, green onions o Garlic rosemary lamb pita: lamb sauted with garlic, rosemary, salt & pepper; add lettuce, cucumber, greek yogurt to pita - flavor with lemon juice and black pepper    Seafood:  o Mediterranean grilled salmon, seasoned with garlic, basil, parsley, lemon juice and black pepper o Shrimp, lemon, and spinach whole-grain pasta salad made with low fat greek yogurt  o Seared scallops with lemon orzo  o Seared tuna steaks seasoned salt, pepper, coriander topped with tomato mixture of olives, tomatoes, olive oil, minced garlic, parsley, green onions and cappers    Meats:  o Herbed greek chicken salad with kalamata olives, cucumber, feta  o Red bell peppers stuffed with spinach, bulgur, lean ground beef (or lentils) & topped with feta   o Kebabs: skewers of chicken, tomatoes, onions, zucchini, squash  o [place] burgers: made with red onions, mint, dill, lemon juice, feta cheese topped with roasted red peppers   Vegetarian o Cucumber salad: cucumbers, artichoke hearts, celery, red onion, feta cheese, tossed in olive oil & lemon juice  o Hummus and whole grain pita points with a greek salad (lettuce, tomato, feta, olives, cucumbers, red onion) o Lentil soup with celery, carrots made with vegetable broth, garlic, salt and pepper  o Tabouli salad: parsley, bulgur, mint, scallions, cucumbers, tomato, radishes, lemon juice, olive oil, salt and pepper. o

## 2014-05-29 NOTE — Addendum Note (Signed)
Addended byCandice Camp on: 05/29/2014 11:45 AM   Modules accepted: Orders

## 2014-05-30 LAB — PAP IG (IMAGE GUIDED)

## 2014-06-06 DIAGNOSIS — Z1231 Encounter for screening mammogram for malignant neoplasm of breast: Secondary | ICD-10-CM | POA: Diagnosis not present

## 2014-06-06 DIAGNOSIS — N6009 Solitary cyst of unspecified breast: Secondary | ICD-10-CM | POA: Diagnosis not present

## 2014-06-18 DIAGNOSIS — Z1231 Encounter for screening mammogram for malignant neoplasm of breast: Secondary | ICD-10-CM | POA: Diagnosis not present

## 2014-06-18 DIAGNOSIS — N6009 Solitary cyst of unspecified breast: Secondary | ICD-10-CM | POA: Diagnosis not present

## 2014-06-26 ENCOUNTER — Encounter: Payer: Self-pay | Admitting: Family Medicine

## 2014-06-26 DIAGNOSIS — R928 Other abnormal and inconclusive findings on diagnostic imaging of breast: Secondary | ICD-10-CM | POA: Insufficient documentation

## 2014-07-04 ENCOUNTER — Encounter: Payer: Self-pay | Admitting: Family Medicine

## 2014-08-08 ENCOUNTER — Other Ambulatory Visit: Payer: Self-pay

## 2014-08-08 MED ORDER — GLUCOSE BLOOD VI STRP: ORAL_STRIP | Status: DC

## 2014-08-08 MED ORDER — LANCETS MISC: Status: DC

## 2014-08-28 ENCOUNTER — Encounter: Payer: Self-pay | Admitting: Family Medicine

## 2014-08-28 ENCOUNTER — Ambulatory Visit (INDEPENDENT_AMBULATORY_CARE_PROVIDER_SITE_OTHER): Payer: Medicare Other | Admitting: Family Medicine

## 2014-08-28 VITALS — BP 114/71 | HR 88 | Temp 98.0°F | Resp 16 | Ht 62.0 in | Wt 196.0 lb

## 2014-08-28 DIAGNOSIS — I10 Essential (primary) hypertension: Secondary | ICD-10-CM

## 2014-08-28 DIAGNOSIS — E119 Type 2 diabetes mellitus without complications: Secondary | ICD-10-CM

## 2014-08-28 LAB — POCT GLYCOSYLATED HEMOGLOBIN (HGB A1C): HEMOGLOBIN A1C: 6.9

## 2014-08-28 NOTE — Progress Notes (Signed)
S:  This 68 y.o. Cauc female is here for Type II DM follow-up; she has lost ~15 lbs w/ better nutrition and physical activity. Pt has officially retired for the 2nd time. She enjoys her free time and feels like she is living healthier. She is compliant w/ medication and FSBS= 130-150. No hypoglycemia.  Patient Active Problem List   Diagnosis Date Noted  . Abnormal mammogram of left breast 06/26/2014  . DDD (degenerative disc disease), lumbar 02/08/2013  . HTN, goal below 140/80 09/21/2012  . Limb pain 09/21/2012  . Hyperlipidemia 09/21/2012  . Type II or unspecified type diabetes mellitus without mention of complication, not stated as uncontrolled 09/21/2012    Prior to Admission medications   Medication Sig Start Date End Date Taking? Authorizing Provider  atorvastatin (LIPITOR) 40 MG tablet TAKE 1 TABLET BY MOUTH ONCE DAILY 05/29/14  Yes Barton Fanny, MD  Metformin (Glucophage) 500 MG tablet Take 1 tablet daily with evening meal. 05/29/2014  Yes Barton Fanny, MD  Blood Glucose Monitoring Suppl ([facility]) KIT To check blood sugars once daily dx code 250.00 one touch ultra 05/29/14  Yes Barton Fanny, MD  glucose blood test strip To check blood sugars once daily dx code E11.9. One Touch [place]ica 86/8/25  Yes Barton Fanny, MD  Lancets MISC To check blood sugars once daily dx code E11.9. one touch ultra 08/08/14  Yes Barton Fanny, MD  lisinopril-hydrochlorothiazide (PRINZIDE,ZESTORETIC) 20-25 MG per tablet TAKE 1 TABLET BY MOUTH DAILY. 05/29/14  Yes Barton Fanny, MD    History   Social History  . Marital Status: Divorced    Spouse Name: N/A    Number of Children: N/A  . Years of Education: N/A   Occupational History  . Retired    Social History Main Topics  . Smoking status: Never Smoker   . Smokeless tobacco: Never Used  . Alcohol Use: No  . Drug Use: No  . Sexual Activity: Not on file   Other Topics Concern  . Not on file    Social History Narrative   Divorced. Education: [employer]. Exercise: No.    ROS: As per HPI; negative for abnormal weight change, anorexia, fatigue, vision disturbances, CP or palpitations, SOB or DOE, cough, GI upset, muscle pains or arthralgias, change in skin or hair, HA, dizziness, weakness, numbness, syncope or psych disturbances.  O: Filed Vitals:   08/28/14 1036  BP: 114/71  Pulse: 88  Temp: 98 F (36.7 C)  Resp: 16    GEN: In NAD; WN,WD. Weight down ~ 16 lbs since Aug 2015. HENT: [place]/AT; EOMI w/ clear conj/sclerae. Otherwise unremarkable. COR: RRR. LUNGS: Normal resp rate and effort. SKIN: W&D; intact w/o diaphoresis, erythema or pallor. NEURO: A&O x 3; CNs intact. Nonfocal.  See DM Foot exam.   Results for orders placed or performed in visit on 08/28/14  POCT glycosylated hemoglobin (Hb A1C)  Result Value Ref Range   Hemoglobin A1C 6.9     A/P: Diabetes mellitus without complication - R4V is at goal. Improved control and excellent weight reduction. Discontinue Metformin. Plan: [facility] Diabetes Foot Exam, POCT glycosylated hemoglobin (Hb A1C)  HTN, goal below 140/80- Stable on current medication.

## 2014-08-28 NOTE — Patient Instructions (Addendum)
I am proud of you for the progress you have made in making positive lifestyle changes. I want you to continue to manage your Diabetes off the Metformin.   I will see you again in 4 months. I hope you will continue to lose weight and get healthier!!!  Enjoy The Holidays!!!  You can return for your Flu vaccine until the end of March 2016.

## 2014-12-17 DIAGNOSIS — N63 Unspecified lump in breast: Secondary | ICD-10-CM | POA: Diagnosis not present

## 2015-01-01 ENCOUNTER — Encounter: Payer: Self-pay | Admitting: Family Medicine

## 2015-01-01 ENCOUNTER — Ambulatory Visit (INDEPENDENT_AMBULATORY_CARE_PROVIDER_SITE_OTHER): Payer: Medicare Other | Admitting: Family Medicine

## 2015-01-01 VITALS — BP 130/82 | HR 78 | Temp 98.0°F | Resp 16 | Ht 62.0 in | Wt 200.0 lb

## 2015-01-01 DIAGNOSIS — Z780 Asymptomatic menopausal state: Secondary | ICD-10-CM | POA: Diagnosis not present

## 2015-01-01 DIAGNOSIS — E119 Type 2 diabetes mellitus without complications: Secondary | ICD-10-CM

## 2015-01-01 DIAGNOSIS — I10 Essential (primary) hypertension: Secondary | ICD-10-CM | POA: Diagnosis not present

## 2015-01-01 DIAGNOSIS — Z8639 Personal history of other endocrine, nutritional and metabolic disease: Secondary | ICD-10-CM

## 2015-01-01 LAB — THYROID PANEL WITH TSH
Free Thyroxine Index: 2.2 (ref 1.4–3.8)
T3 UPTAKE: 28 % (ref 22–35)
T4, Total: 7.7 ug/dL (ref 4.5–12.0)
TSH: 1.361 u[IU]/mL (ref 0.350–4.500)

## 2015-01-01 LAB — BASIC METABOLIC PANEL
BUN: 29 mg/dL — ABNORMAL HIGH (ref 6–23)
CALCIUM: 9.5 mg/dL (ref 8.4–10.5)
CHLORIDE: 104 meq/L (ref 96–112)
CO2: 25 mEq/L (ref 19–32)
CREATININE: 1.01 mg/dL (ref 0.50–1.10)
GLUCOSE: 119 mg/dL — AB (ref 70–99)
Potassium: 4.4 mEq/L (ref 3.5–5.3)
Sodium: 140 mEq/L (ref 135–145)

## 2015-01-01 LAB — HEMOGLOBIN A1C
Hgb A1c MFr Bld: 7.8 % — ABNORMAL HIGH (ref <5.7)
Mean Plasma Glucose: 177 mg/dL — ABNORMAL HIGH (ref <117)

## 2015-01-01 NOTE — Patient Instructions (Signed)
Mediterranean Diet  Why follow it? Research shows. . Those who follow the Mediterranean diet have a reduced risk of heart disease  . The diet is associated with a reduced incidence of Parkinson's and Alzheimer's diseases . People following the diet may have longer life expectancies and lower rates of chronic diseases  . The Dietary Guidelines for Americans recommends the Mediterranean diet as an eating plan to promote health and prevent disease  What Is the Mediterranean Diet?  . Healthy eating plan based on typical foods and recipes of Mediterranean-style cooking . The diet is primarily a plant based diet; these foods should make up a majority of meals   Starches - Plant based foods should make up a majority of meals - They are an important sources of vitamins, minerals, energy, antioxidants, and fiber - Choose whole grains, foods high in fiber and minimally processed items  - Typical grain sources include wheat, oats, barley, corn, brown rice, bulgar, farro, millet, polenta, couscous  - Various types of beans include chickpeas, lentils, fava beans, black beans, white beans   Fruits  Veggies - Large quantities of antioxidant rich fruits & veggies; 6 or more servings  - Vegetables can be eaten raw or lightly drizzled with oil and cooked  - Vegetables common to the traditional Mediterranean Diet include: artichokes, arugula, beets, broccoli, brussel sprouts, cabbage, carrots, celery, collard greens, cucumbers, eggplant, kale, leeks, lemons, lettuce, mushrooms, okra, onions, peas, peppers, potatoes, pumpkin, radishes, rutabaga, shallots, spinach, sweet potatoes, turnips, zucchini - Fruits common to the Mediterranean Diet include: apples, apricots, avocados, cherries, clementines, dates, figs, grapefruits, grapes, melons, nectarines, oranges, peaches, pears, pomegranates, strawberries, tangerines  Fats - Replace butter and margarine with healthy oils, such as olive oil, canola oil, and tahini   - Limit nuts to no more than a handful a day  - Nuts include walnuts, almonds, pecans, pistachios, pine nuts  - Limit or avoid candied, honey roasted or heavily salted nuts - Olives are central to the [employer] - can be eaten whole or used in a variety of dishes   Meats Protein - Limiting red meat: no more than a few times a month - When eating red meat: choose lean cuts and keep the portion to the size of deck of cards - Eggs: approx. 0 to 4 times a week  - Fish and lean poultry: at least 2 a week  - Healthy protein sources include, chicken, [place], lean beef, lamb - Increase intake of seafood such as tuna, salmon, trout, mackerel, shrimp, scallops - Avoid or limit high fat processed meats such as sausage and bacon  Dairy - Include moderate amounts of low fat dairy products  - Focus on healthy dairy such as fat free yogurt, skim milk, low or reduced fat cheese - Limit dairy products higher in fat such as whole or 2% milk, cheese, ice cream  Alcohol - Moderate amounts of red wine is ok  - No more than 5 oz daily for women (all ages) and men older than age 81  - No more than 10 oz of wine daily for men younger than 34  Other - Limit sweets and other desserts  - Use herbs and spices instead of salt to flavor foods  - Herbs and spices common to the traditional Mediterranean Diet include: basil, bay leaves, chives, cloves, cumin, fennel, garlic, lavender, marjoram, mint, oregano, parsley, pepper, rosemary, sage, savory, sumac, tarragon, thyme   It's not just a diet, it's a lifestyle:  . The  Mediterranean diet includes lifestyle factors typical of those in the region  . Foods, drinks and meals are best eaten with others and savored . Daily physical activity is important for overall good health . This could be strenuous exercise like running and aerobics . This could also be more leisurely activities such as walking, housework, yard-work, or taking the stairs . Moderation is the key;  a balanced and healthy diet accommodates most foods and drinks . Consider portion sizes and frequency of consumption of certain foods   Meal Ideas & Options:  . Breakfast:  o Whole wheat toast or whole wheat English muffins with peanut butter & hard boiled egg o Steel cut oats topped with apples & cinnamon and skim milk  o Fresh fruit: banana, strawberries, melon, berries, peaches  o Smoothies: strawberries, bananas, greek yogurt, peanut butter o Low fat greek yogurt with blueberries and granola  o Egg white omelet with spinach and mushrooms o Breakfast couscous: whole wheat couscous, apricots, skim milk, cranberries  . Sandwiches:  o Hummus and grilled vegetables (peppers, zucchini, squash) on whole wheat bread   o Grilled chicken on whole wheat pita with lettuce, tomatoes, cucumbers or tzatziki  o Tuna salad on whole wheat bread: tuna salad made with greek yogurt, olives, red peppers, capers, green onions o Garlic rosemary lamb pita: lamb sauted with garlic, rosemary, salt & pepper; add lettuce, cucumber, greek yogurt to pita - flavor with lemon juice and black pepper  . Seafood:  o Mediterranean grilled salmon, seasoned with garlic, basil, parsley, lemon juice and black pepper o Shrimp, lemon, and spinach whole-grain pasta salad made with low fat greek yogurt  o Seared scallops with lemon orzo  o Seared tuna steaks seasoned salt, pepper, coriander topped with tomato mixture of olives, tomatoes, olive oil, minced garlic, parsley, green onions and cappers  . Meats:  o Herbed greek chicken salad with kalamata olives, cucumber, feta  o Red bell peppers stuffed with spinach, bulgur, lean ground beef (or lentils) & topped with feta   o Kebabs: skewers of chicken, tomatoes, onions, zucchini, squash  o [place] burgers: made with red onions, mint, dill, lemon juice, feta cheese topped with roasted red peppers . Vegetarian o Cucumber salad: cucumbers, artichoke hearts, celery, red onion, feta  cheese, tossed in olive oil & lemon juice  o Hummus and whole grain pita points with a greek salad (lettuce, tomato, feta, olives, cucumbers, red onion) o Lentil soup with celery, carrots made with vegetable broth, garlic, salt and pepper  o Tabouli salad: parsley, bulgur, mint, scallions, cucumbers, tomato, radishes, lemon juice, olive oil, salt and pepper.     Vitamin D Deficiency Vitamin D is an important vitamin that your body needs. Having too little of it in your body is called a deficiency. A very bad deficiency can make your bones soft and can cause a condition called rickets.  Vitamin D is important to your body for different reasons, such as:   It helps your body absorb 2 minerals called calcium and phosphorus.  It helps make your bones healthy.  It may prevent some diseases, such as diabetes and multiple sclerosis.  It helps your muscles and heart. You can get vitamin D in several ways. It is a natural part of some foods. The vitamin is also added to some dairy products and cereals. Some people take vitamin D supplements. Also, your body makes vitamin D when you are in the sun. It changes the sun's rays into a form of  the vitamin that your body can use. CAUSES   Not eating enough foods that contain vitamin D.  Not getting enough sunlight.  Having certain digestive system diseases that make it hard to absorb vitamin D. These diseases include Crohn's disease, chronic pancreatitis, and cystic fibrosis.  Having a surgery in which part of the stomach or small intestine is removed.  Being obese. Fat cells pull vitamin D out of your blood. That means that obese people may not have enough vitamin D left in their blood and in other body tissues.  Having chronic kidney or liver disease. RISK FACTORS Risk factors are things that make you more likely to develop a vitamin D deficiency. They include:  Being older.  Not being able to get outside very much.  Living in a nursing  home.  Having had broken bones.  Having weak or thin bones (osteoporosis).  Having a disease or condition that changes how your body absorbs vitamin D.  Having dark skin.  Some medicines such as seizure medicines or steroids.  Being overweight or obese. SYMPTOMS Mild cases of vitamin D deficiency may not have any symptoms. If you have a very bad case, symptoms may include:  Bone pain.  Muscle pain.  Falling often.  Broken bones caused by a minor injury, due to osteoporosis. DIAGNOSIS A blood test is the best way to tell if you have a vitamin D deficiency. TREATMENT Vitamin D deficiency can be treated in different ways. Treatment for vitamin D deficiency depends on what is causing it. Options include:  Taking vitamin D supplements.  Taking a calcium supplement. Your caregiver will suggest what dose is best for you. HOME CARE INSTRUCTIONS  Take any supplements that your caregiver prescribes. Follow the directions carefully. Take only the suggested amount.  Have your blood tested 2 months after you start taking supplements.  Eat foods that contain vitamin D. Healthy choices include:  Fortified dairy products, cereals, or juices. Fortified means vitamin D has been added to the food. Check the label on the package to be sure.  Fatty fish like salmon or trout.  Eggs.  Oysters.  Do not use a tanning bed.  Keep your weight at a healthy level. Lose weight if you need to.  Keep all follow-up appointments. Your caregiver will need to perform blood tests to make sure your vitamin D deficiency is going away. SEEK MEDICAL CARE IF:  You have any questions about your treatment.  You continue to have symptoms of vitamin D deficiency.  You have nausea or vomiting.  You are constipated.  You feel confused.  You have severe abdominal or back pain. MAKE SURE YOU:  Understand these instructions.  Will watch your condition.  Will get help right away if you are not  doing well or get worse. Document Released: 12/13/2011 Document Revised: 01/15/2013 Document Reviewed: 12/13/2011 [facility] Patient Information 2015 [place], [place]. This information is not intended to replace advice given to you by your health care provider. Make sure you discuss any questions you have with your health care provider.

## 2015-01-02 NOTE — Progress Notes (Signed)
S:  This 69 y.o. Female has HTN, lipid disorder and diet-controlled Type II DM. Metformin was prescribed in Aug 2015 and discontinued in Fall 2015 when pt lost weight and A1c= 6.9%. She maintains a healthy lifestyle and continues to work on weight loss. She would prefer not to resume Metformin. Pt is compliant w/ lisinopril- HCTZ and atorvastatin w/o adverse effects. She takes  Vit D 1000 units and needs bone density study.   Pt is going on a cruise in [place] later this spring.  Patient Active Problem List   Diagnosis Date Noted  . Abnormal mammogram of left breast 06/26/2014  . DDD (degenerative disc disease), lumbar 02/08/2013  . HTN, goal below 140/80 09/21/2012  . Limb pain 09/21/2012  . Hyperlipidemia 09/21/2012  . Type II or unspecified type diabetes mellitus without mention of complication, not stated as uncontrolled 09/21/2012    Prior to Admission medications   Medication Sig Start Date End Date Taking? Authorizing Provider  atorvastatin (LIPITOR) 40 MG tablet TAKE 1 TABLET BY MOUTH ONCE DAILY 05/29/14  Yes Barton Fanny, MD  Blood Glucose Monitoring Suppl ([place]) KIT To check blood sugars once daily dx code 250.00 one touch ultra 05/29/14  Yes Barton Fanny, MD  glucose blood test strip To check blood sugars once daily dx code E11.9. One Touch Delica 54/5/62  Yes Barton Fanny, MD  Lancets MISC To check blood sugars once daily dx code E11.9. one touch ultra 08/08/14  Yes Barton Fanny, MD  lisinopril-hydrochlorothiazide (PRINZIDE,ZESTORETIC) 20-25 MG per tablet TAKE 1 TABLET BY MOUTH DAILY. 05/29/14  Yes Barton Fanny, MD    SURG, SOC and FAM HX reviewed.  ROS: Negative for diaphoresis, fatigue, vision disturbances, CP or tightness, palpitations, cough, SOB or DOE, edema, HA, dizziness, numbness, weakness or syncope.  O: Filed Vitals:   01/01/15 0831  BP: 130/82  Pulse: 78  Temp: 98 F (36.7 C)  Resp: 16    GEN: In NAD:  WN,WD. HENT: [place]/AT;EOMI w/ clear conj/sclerae. Otherwise unremarkable. COR: RRR. LUNGS: Normal resp rate and effort. SKIN; W&D; intact w/o erythema, jaundice or pallor. See DM FOOT EXAM. NEURO: A&O x 3; CNs intact. Nonfocal.  A/P: History of vitamin D deficiency- Advised to increase Vit D to 2000 units daily.  HTN, goal below 140/80 - Continue current medications. Plan: Basic metabolic panel, Thyroid Panel With TSH  Diabetes mellitus without complication - RE-commit to better nutrition and regular exercise routine. Plan: Hemoglobin A1c, Thyroid Panel With TSH  Postmenopausal estrogen deficiency - Plan: DG Bone Density

## 2015-02-17 ENCOUNTER — Encounter: Payer: Self-pay | Admitting: Family Medicine

## 2015-02-18 ENCOUNTER — Encounter: Payer: Self-pay | Admitting: Family Medicine

## 2015-02-22 ENCOUNTER — Ambulatory Visit (INDEPENDENT_AMBULATORY_CARE_PROVIDER_SITE_OTHER): Payer: Medicare Other | Admitting: Family Medicine

## 2015-02-22 VITALS — BP 110/74 | HR 86 | Temp 98.2°F | Ht 62.5 in | Wt 201.0 lb

## 2015-02-22 DIAGNOSIS — M79674 Pain in right toe(s): Secondary | ICD-10-CM

## 2015-02-22 DIAGNOSIS — E11628 Type 2 diabetes mellitus with other skin complications: Secondary | ICD-10-CM

## 2015-02-22 MED ORDER — DOXYCYCLINE HYCLATE 100 MG PO TABS: 100.0000 mg | ORAL_TABLET | Freq: Two times a day (BID) | ORAL | Status: DC

## 2015-02-22 NOTE — Patient Instructions (Signed)
Doxycycline 1 twice daily. Cautioned that some people sunburn easier when on the doxycycline. Take the medicine with a little food, but not with dairy products which inhibit absorption of the medicine.  Watch diabetes and diabetic diet closely  Return at any time if additional concerns, but come back in 2-3 days if it does not seem to be improving at all.

## 2015-02-22 NOTE — Progress Notes (Signed)
Subjective: 69 year old patient who has previously been seeing Dr. Dolores Frame but will transition over to Dr. Reginia Forts with an appointment in September. The patient has had back surgery and has a tough time trimming her own nails. She had to pull hard it the toes to get them to where she could trim them recently, and the left second toe has developed pain the last couple of days. No obvious infection. She was concerned about it because she is a diabetic with diet control which was not the best last time she was here in March. She has been working on her diet.  Objective: Left second toe minimally erythematous. There is minimal erythema along the right side of the toenail. The main tenderness to touch is more along the proximal phalanx however. There is no drainage and no open wound and no obvious paronychia  Assessment: Erythema and pain left second toe, early infection versus trauma from pulling toe when trimming nail Type 2 diabetes  Plan: Patient is allergic to penicillin. Will treat with doxycycline. She is to keep a close eye on this and return if not improving over the next 48-72 hours.  Monitor her diabetic diet closely to keep sugars down.

## 2015-04-14 DIAGNOSIS — Z4789 Encounter for other orthopedic aftercare: Secondary | ICD-10-CM | POA: Diagnosis not present

## 2015-04-25 ENCOUNTER — Telehealth: Payer: Self-pay | Admitting: *Deleted

## 2015-04-25 ENCOUNTER — Ambulatory Visit (INDEPENDENT_AMBULATORY_CARE_PROVIDER_SITE_OTHER): Payer: Medicare Other | Admitting: Family Medicine

## 2015-04-25 ENCOUNTER — Encounter: Payer: Self-pay | Admitting: Family Medicine

## 2015-04-25 VITALS — BP 120/76 | HR 106 | Temp 98.5°F | Resp 16 | Ht 62.5 in | Wt 199.0 lb

## 2015-04-25 DIAGNOSIS — N644 Mastodynia: Secondary | ICD-10-CM | POA: Diagnosis not present

## 2015-04-25 NOTE — Patient Instructions (Signed)
1. Return for fever>100.5, redness to skin of breast, increasing pain.

## 2015-04-25 NOTE — Telephone Encounter (Signed)
Faxed orders for US Breast and MM of left breast, per Dr Tamala Julian. Patient advised in the office before leaving, appointment is 04/30/2015 at 7:45 am and patient should arrived at 7:30 am.

## 2015-04-25 NOTE — Progress Notes (Signed)
Subjective:    Patient ID: Erica Thomas, female    DOB: Jun 27, 1946, 69 y.o.   MRN: [id number]  04/25/2015  [place]   HPI This 69 y.o. female presents for evaluation of L breast pain onset this morning.  S/p mammogram with breast US 12/17/2014 that revealed L breast cyst 0.7cm at 10 o'clock position.  Current pain at 2 o'clock position of breast and extends into L axilla region. No injury or trauma.  Noticed pain with changing clothes this morning; noticed swelling in mirror of affected area.  Denies fever/chills/sweats.  Denies redness of skin.  No similar symptoms in the past; no family history of breast cancer.   Review of Systems  Constitutional: Negative for fever, chills, diaphoresis and fatigue.  Skin: Negative for color change, pallor, rash and wound.    Past Medical History  Diagnosis Date  . Hyperlipidemia   . Cataract   . Hypertension   . Diabetes mellitus without complication     control by diet only  . Arthritis   . Blood transfusion without reported diagnosis    Past Surgical History  Procedure Laterality Date  . Appendectomy    . Abdominal hysterectomy  1974    Class V PAP  . Colonoscopy    . Tonsillectomy    . Spinal cord decompression  03/28/2013    L5 S1   Dr Rolena Infante   Allergies  Allergen Reactions  . Penicillins Hives    LEGS   Current Outpatient Prescriptions  Medication Sig Dispense Refill  . atorvastatin (LIPITOR) 40 MG tablet TAKE 1 TABLET BY MOUTH ONCE DAILY 30 tablet 11  . Cyanocobalamin (VITAMIN B 12 PO) Take by mouth daily.    Marland Kitchen glucose blood test strip To check blood sugars once daily dx code E11.9. One Touch Delica 222 each 3  . lisinopril-hydrochlorothiazide (PRINZIDE,ZESTORETIC) 20-25 MG per tablet TAKE 1 TABLET BY MOUTH DAILY. 30 tablet 11  . MULTIPLE VITAMIN PO Take by mouth daily.    . Probiotic Product (PROBIOTIC DAILY PO) Take by mouth daily.    . Blood Glucose Monitoring Suppl (GLUCOLAB GLUCOSE MONITORING) KIT To check blood sugars  once daily dx code 250.00 one touch ultra 1 each 0  . Lancets MISC To check blood sugars once daily dx code E11.9. one touch ultra 100 each 3   No current facility-administered medications for this visit.   History   Social History  . Marital Status: Divorced    Spouse Name: N/A  . Number of Children: N/A  . Years of Education: N/A   Occupational History  . Retired    Social History Main Topics  . Smoking status: Never Smoker   . Smokeless tobacco: Never Used  . Alcohol Use: No  . Drug Use: No  . Sexual Activity: Not on file   Other Topics Concern  . Not on file   Social History Narrative   Divorced. Education: [employer]. Exercise: No.    Family History  Problem Relation Age of Onset  . Stroke Mother   . Diabetes Mother   . Heart disease Mother   . Hyperlipidemia Mother   . Heart disease Father   . Cancer Sister     Ovarian and stomach cancer  . Diabetes Maternal Grandmother   . Heart disease Maternal Grandmother   . Cancer Maternal Grandfather   . Arthritis Paternal Grandmother        Objective:    BP 120/76 mmHg  Pulse 106  Temp(Src) 98.5 F (36.9  C) (Oral)  Resp 16  Ht 5' 2.5" (1.588 m)  Wt 199 lb (90.266 kg)  BMI 35.80 kg/m2 Physical Exam  Constitutional: She is oriented to person, place, and time. She appears well-developed and well-nourished. No distress.  HENT:  Head: Normocephalic and atraumatic.  Eyes: Conjunctivae are normal. Pupils are equal, round, and reactive to light.  Neck: Normal range of motion. Neck supple.  Cardiovascular: Normal rate, regular rhythm and normal heart sounds.  Exam reveals no gallop and no friction rub.   No murmur heard. Pulmonary/Chest: Effort normal and breath sounds normal. She has no wheezes. She has no rales. She exhibits no mass. Right breast exhibits no inverted nipple, no mass, no nipple discharge, no skin change and no tenderness. Left breast exhibits no inverted nipple, no mass, no nipple discharge, no skin  change and no tenderness. Breasts are symmetrical.    +TTP along 2 o'clock position of L breast; mild induration of area without discrete mass; +TTP extends into axilla area; possible axillary LAD.    Neurological: She is alert and oriented to person, place, and time.  Skin: Skin is warm and dry. She is not diaphoretic. No erythema.  No skin erythema of L breast or axilla.  Psychiatric: She has a normal mood and affect. Her behavior is normal.  Nursing note and vitals reviewed.  Results for orders placed or performed in visit on 01/01/15  Hemoglobin A1c  Result Value Ref Range   Hgb A1c MFr Bld 7.8 (H) <5.7 %   Mean Plasma Glucose 177 (H) <117 mg/dL  Basic metabolic panel  Result Value Ref Range   Sodium 140 135 - 145 mEq/L   Potassium 4.4 3.5 - 5.3 mEq/L   Chloride 104 96 - 112 mEq/L   CO2 25 19 - 32 mEq/L   Glucose, Bld 119 (H) 70 - 99 mg/dL   BUN 29 (H) 6 - 23 mg/dL   Creat 1.01 0.50 - 1.10 mg/dL   Calcium 9.5 8.4 - 10.5 mg/dL  Thyroid Panel With TSH  Result Value Ref Range   T4, Total 7.7 4.5 - 12.0 ug/dL   T3 Uptake 28 22 - 35 %   Free Thyroxine Index 2.2 1.4 - 3.8   TSH 1.361 0.350 - 4.500 uIU/mL       Assessment & Plan:   1. Breast pain, left    -New. -Refer or diagnostic mammogram and [place]. -Treat with Tylenol or Motrin. -RTC for development of fever>100.5, redness to the skin, increase in swelling or pain.   Meds ordered this encounter  Medications  . MULTIPLE VITAMIN PO    Sig: Take by mouth daily.  . Cyanocobalamin (VITAMIN B 12 PO)    Sig: Take by mouth daily.  . Probiotic Product (PROBIOTIC DAILY PO)    Sig: Take by mouth daily.    No Follow-up on file.     Altheia Shafran Elayne Guerin, M.D. [facility] [address] [place], [place]  [address] [phone] phone [phone] fax

## 2015-04-30 DIAGNOSIS — N6002 Solitary cyst of left breast: Secondary | ICD-10-CM | POA: Diagnosis not present

## 2015-04-30 DIAGNOSIS — N63 Unspecified lump in breast: Secondary | ICD-10-CM | POA: Diagnosis not present

## 2015-05-03 ENCOUNTER — Telehealth: Payer: Self-pay | Admitting: Family Medicine

## 2015-05-03 NOTE — Telephone Encounter (Signed)
Left message of new appt date with Dr Tamala Julian 06-13-15 @9  CPE

## 2015-06-11 ENCOUNTER — Encounter: Payer: Medicare Other | Admitting: Family Medicine

## 2015-06-13 ENCOUNTER — Encounter: Payer: Medicare Other | Admitting: Family Medicine

## 2015-06-30 ENCOUNTER — Other Ambulatory Visit: Payer: Self-pay

## 2015-06-30 MED ORDER — LISINOPRIL-HYDROCHLOROTHIAZIDE 20-25 MG PO TABS: ORAL_TABLET | ORAL | Status: DC

## 2015-07-24 ENCOUNTER — Telehealth: Payer: Self-pay | Admitting: Family Medicine

## 2015-07-24 NOTE — Telephone Encounter (Signed)
LEFT A MESSAGE FOR PATIENT TO RETURN CALL TO SCHEDULE APPOINTMENT 

## 2015-09-04 DIAGNOSIS — H2513 Age-related nuclear cataract, bilateral: Secondary | ICD-10-CM | POA: Diagnosis not present

## 2015-09-04 DIAGNOSIS — H401131 Primary open-angle glaucoma, bilateral, mild stage: Secondary | ICD-10-CM | POA: Diagnosis not present

## 2015-09-04 DIAGNOSIS — E119 Type 2 diabetes mellitus without complications: Secondary | ICD-10-CM | POA: Diagnosis not present

## 2015-09-22 ENCOUNTER — Telehealth: Payer: Self-pay | Admitting: Family Medicine

## 2015-09-22 NOTE — Telephone Encounter (Signed)
Left a message for patient to return call to schedule her annual exam.. Needs a flu vaccine bmi screening and statin therapy.

## 2015-12-04 DIAGNOSIS — E119 Type 2 diabetes mellitus without complications: Secondary | ICD-10-CM | POA: Diagnosis not present

## 2015-12-04 DIAGNOSIS — H2513 Age-related nuclear cataract, bilateral: Secondary | ICD-10-CM | POA: Diagnosis not present

## 2015-12-04 DIAGNOSIS — H401331 Pigmentary glaucoma, bilateral, mild stage: Secondary | ICD-10-CM | POA: Diagnosis not present

## 2016-03-09 DIAGNOSIS — H401331 Pigmentary glaucoma, bilateral, mild stage: Secondary | ICD-10-CM | POA: Diagnosis not present

## 2016-03-10 DIAGNOSIS — Z4789 Encounter for other orthopedic aftercare: Secondary | ICD-10-CM | POA: Diagnosis not present

## 2016-03-10 DIAGNOSIS — Z9889 Other specified postprocedural states: Secondary | ICD-10-CM | POA: Diagnosis not present

## 2016-05-03 DIAGNOSIS — Z1231 Encounter for screening mammogram for malignant neoplasm of breast: Secondary | ICD-10-CM | POA: Diagnosis not present

## 2016-08-23 DIAGNOSIS — H401331 Pigmentary glaucoma, bilateral, mild stage: Secondary | ICD-10-CM | POA: Diagnosis not present

## 2016-08-23 DIAGNOSIS — H2513 Age-related nuclear cataract, bilateral: Secondary | ICD-10-CM | POA: Diagnosis not present

## 2016-08-23 DIAGNOSIS — E119 Type 2 diabetes mellitus without complications: Secondary | ICD-10-CM | POA: Diagnosis not present

## 2017-03-27 ENCOUNTER — Encounter (HOSPITAL_COMMUNITY): Payer: Self-pay | Admitting: Emergency Medicine

## 2017-03-27 ENCOUNTER — Telehealth (HOSPITAL_COMMUNITY): Payer: Self-pay | Admitting: *Deleted

## 2017-03-27 ENCOUNTER — Ambulatory Visit (HOSPITAL_COMMUNITY)
Admission: EM | Admit: 2017-03-27 | Discharge: 2017-03-27 | Disposition: A | Discharge status: Home / Self Care | Payer: PPO | Attending: Family Medicine | Admitting: Family Medicine

## 2017-03-27 DIAGNOSIS — S93602A Unspecified sprain of left foot, initial encounter: Secondary | ICD-10-CM | POA: Diagnosis not present

## 2017-03-27 DIAGNOSIS — M79672 Pain in left foot: Secondary | ICD-10-CM

## 2017-03-27 MED ORDER — IBUPROFEN 600 MG PO TABS: 600.0000 mg | ORAL_TABLET | Freq: Four times a day (QID) | ORAL | 0 refills | Status: DC | PRN

## 2017-03-27 NOTE — ED Triage Notes (Signed)
Pt reports sitting at her desk a few hours ago when she felt like she had a "charlie horse" in her left lateral foot.  Pt states that it has not gone away.  Pt has some redness and swelling to the area.  She has pointed out a red spot in the middle that she is wondering if it is a bite of some kind.

## 2017-03-27 NOTE — ED Provider Notes (Signed)
CSN: [id number]     Arrival date & time 03/27/17  1730 History   First MD Initiated Contact with Patient 03/27/17 1804     Chief Complaint  Patient presents with  . Foot Pain    left   (Consider location/radiation/quality/duration/timing/severity/associated sxs/prior Treatment) Patient c/o left foot pain and discomfort.   The history is provided by the patient.  Foot Pain  This is a new problem. The problem occurs constantly. The problem has not changed since onset.Nothing aggravates the symptoms. Nothing relieves the symptoms.    Past Medical History:  Diagnosis Date  . Arthritis   . Blood transfusion without reported diagnosis   . Cataract   . Diabetes mellitus without complication ([place])    control by diet only  . Hyperlipidemia   . Hypertension    Past Surgical History:  Procedure Laterality Date  . ABDOMINAL HYSTERECTOMY  1974   Class V PAP  . APPENDECTOMY    . COLONOSCOPY    . SPINAL CORD DECOMPRESSION  03/28/2013   L5 S1   Dr Rolena Infante  . TONSILLECTOMY     Family History  Problem Relation Age of Onset  . Stroke Mother   . Diabetes Mother   . Heart disease Mother   . Hyperlipidemia Mother   . Heart disease Father   . Cancer Sister        Ovarian and stomach cancer  . Diabetes Maternal Grandmother   . Heart disease Maternal Grandmother   . Cancer Maternal Grandfather   . Arthritis Paternal Grandmother    Social History  Substance Use Topics  . Smoking status: Never Smoker  . Smokeless tobacco: Never Used  . Alcohol use No   OB History    No data available     Review of Systems  Constitutional: Negative.   HENT: Negative.   Eyes: Negative.   Respiratory: Negative.   Cardiovascular: Negative.   Gastrointestinal: Negative.   Endocrine: Negative.   Genitourinary: Negative.   Musculoskeletal: Positive for arthralgias.  Allergic/Immunologic: Negative.   Neurological: Negative.   Hematological: Negative.   Psychiatric/Behavioral: Negative.      Allergies  Penicillins  Home Medications   Prior to Admission medications   Medication Sig Start Date End Date Taking? Authorizing Provider  atorvastatin (LIPITOR) 40 MG tablet TAKE 1 TABLET BY MOUTH ONCE DAILY 05/29/14   Barton Fanny, MD  Blood Glucose Monitoring Suppl (GLUCOLAB GLUCOSE MONITORING) KIT To check blood sugars once daily dx code 250.00 one touch ultra 05/29/14   Barton Fanny, MD  Cyanocobalamin (VITAMIN B 12 PO) Take by mouth daily.    [provider]  glucose blood test strip To check blood sugars once daily dx code E11.9. One Touch [place] 38/2/50   Barton Fanny, MD  ibuprofen (ADVIL,MOTRIN) 600 MG tablet Take 1 tablet (600 mg total) by mouth every 6 (six) hours as needed. 03/27/17   Lysbeth Penner, FNP  Lancets MISC To check blood sugars once daily dx code E11.9. one touch ultra 08/08/14   Barton Fanny, MD  lisinopril-hydrochlorothiazide (PRINZIDE,ZESTORETIC) 20-25 MG per tablet Take 1 tablet by mouth daily. PATIENT NEEDS OFFICE VISIT FOR ADDITIONAL REFILLS 06/30/15   [name], PA-C  MULTIPLE VITAMIN PO Take by mouth daily.    [provider]  Probiotic Product (PROBIOTIC DAILY PO) Take by mouth daily.    [provider]   Meds Ordered and Administered this Visit  Medications - No data to display  BP (!) 187/92 (BP  Location: Right Arm)   Pulse 90   Temp 98.2 F (36.8 C) (Oral)   SpO2 98%  No data found.   Physical Exam  Constitutional: She is oriented to person, place, and time. She appears well-developed and well-nourished.  HENT:  Head: Normocephalic and atraumatic.  Eyes: Conjunctivae and EOM are normal. Pupils are equal, round, and reactive to light.  Neck: Normal range of motion. Neck supple.  Cardiovascular: Normal rate, regular rhythm and normal heart sounds.   Pulmonary/Chest: Effort normal and breath sounds normal.  Abdominal: Soft. Bowel sounds are normal.  Musculoskeletal: She exhibits  tenderness.  TTP left foot.  Neurological: She is alert and oriented to person, place, and time.  Nursing note and vitals reviewed.   Urgent Care Course     Procedures (including critical care time)  Labs Review Labs Reviewed - No data to display  Imaging Review No results found.   Visual Acuity Review  Right Eye Distance:   Left Eye Distance:   Bilateral Distance:    Right Eye Near:   Left Eye Near:    Bilateral Near:         MDM   1. Sprain of left foot, initial encounter    Motrin 62m one po tid x 10 days [place] [phone]

## 2017-03-27 NOTE — Telephone Encounter (Signed)
Pt requesting Rx to be sent to [facility] [address] due to originally pharmacy being closed.

## 2017-09-30 ENCOUNTER — Encounter: Payer: Self-pay | Admitting: Urgent Care

## 2017-09-30 ENCOUNTER — Ambulatory Visit: Payer: PPO | Admitting: Urgent Care

## 2017-09-30 ENCOUNTER — Other Ambulatory Visit: Payer: Self-pay

## 2017-09-30 VITALS — BP 183/101 | HR 96 | Temp 98.7°F | Resp 18 | Ht 62.5 in | Wt 197.2 lb

## 2017-09-30 DIAGNOSIS — R030 Elevated blood-pressure reading, without diagnosis of hypertension: Secondary | ICD-10-CM | POA: Diagnosis not present

## 2017-09-30 DIAGNOSIS — I10 Essential (primary) hypertension: Secondary | ICD-10-CM | POA: Diagnosis not present

## 2017-09-30 DIAGNOSIS — R609 Edema, unspecified: Secondary | ICD-10-CM | POA: Diagnosis not present

## 2017-09-30 DIAGNOSIS — T17320A Food in larynx causing asphyxiation, initial encounter: Secondary | ICD-10-CM

## 2017-09-30 LAB — POCT URINALYSIS DIP (MANUAL ENTRY)
BILIRUBIN UA: NEGATIVE
Blood, UA: NEGATIVE
GLUCOSE UA: NEGATIVE mg/dL
Leukocytes, UA: NEGATIVE
Nitrite, UA: NEGATIVE
SPEC GRAV UA: 1.025 (ref 1.010–1.025)
Urobilinogen, UA: 0.2 E.U./dL
pH, UA: 6 (ref 5.0–8.0)

## 2017-09-30 MED ORDER — FUROSEMIDE 40 MG PO TABS
40.0000 mg | ORAL_TABLET | Freq: Every day | ORAL | 3 refills | Status: DC
Start: 2017-09-30 — End: 2017-10-03

## 2017-09-30 NOTE — Patient Instructions (Addendum)
Eat more foods that contain a lot of potassium, such as: ? Nuts, such as peanuts and pistachios. ? Seeds, such as sunflower seeds and pumpkin seeds. ? Peas, lentils, and lima beans. ? Whole grain and bran cereals and breads. ? Fresh fruits and vegetables, such as apricots, avocado, bananas, cantaloupe, kiwi, oranges, tomatoes, asparagus, and potatoes. ? Orange juice. ? Tomato juice. ? Red meats. ? Yogurt.    Hypertension Hypertension, commonly called high blood pressure, is when the force of blood pumping through the arteries is too strong. The arteries are the blood vessels that carry blood from the heart throughout the body. Hypertension forces the heart to work harder to pump blood and may cause arteries to become narrow or stiff. Having untreated or uncontrolled hypertension can cause heart attacks, strokes, kidney disease, and other problems. A blood pressure reading consists of a higher number over a lower number. Ideally, your blood pressure should be below 120/80. The first ("top") number is called the systolic pressure. It is a measure of the pressure in your arteries as your heart beats. The second ("bottom") number is called the diastolic pressure. It is a measure of the pressure in your arteries as the heart relaxes. What are the causes? The cause of this condition is not known. What increases the risk? Some risk factors for high blood pressure are under your control. Others are not. Factors you can change  Smoking.  Having type 2 diabetes mellitus, high cholesterol, or both.  Not getting enough exercise or physical activity.  Being overweight.  Having too much fat, sugar, calories, or salt (sodium) in your diet.  Drinking too much alcohol. Factors that are difficult or impossible to change  Having chronic kidney disease.  Having a family history of high blood pressure.  Age. Risk increases with age.  Race. You may be at higher risk if you are  African-American.  Gender. Men are at higher risk than women before age 65. After age 23, women are at higher risk than men.  Having obstructive sleep apnea.  Stress. What are the signs or symptoms? Extremely high blood pressure (hypertensive crisis) may cause:  Headache.  Anxiety.  Shortness of breath.  Nosebleed.  Nausea and vomiting.  Severe chest pain.  Jerky movements you cannot control (seizures).  How is this diagnosed? This condition is diagnosed by measuring your blood pressure while you are seated, with your arm resting on a surface. The cuff of the blood pressure monitor will be placed directly against the skin of your upper arm at the level of your heart. It should be measured at least twice using the same arm. Certain conditions can cause a difference in blood pressure between your right and left arms. Certain factors can cause blood pressure readings to be lower or higher than normal (elevated) for a short period of time:  When your blood pressure is higher when you are in a health care provider's office than when you are at home, this is called white coat hypertension. Most people with this condition do not need medicines.  When your blood pressure is higher at home than when you are in a health care provider's office, this is called masked hypertension. Most people with this condition may need medicines to control blood pressure.  If you have a high blood pressure reading during one visit or you have normal blood pressure with other risk factors:  You may be asked to return on a different day to have your blood pressure checked  again.  You may be asked to monitor your blood pressure at home for 1 week or longer.  If you are diagnosed with hypertension, you may have other blood or imaging tests to help your health care provider understand your overall risk for other conditions. How is this treated? This condition is treated by making healthy lifestyle changes,  such as eating healthy foods, exercising more, and reducing your alcohol intake. Your health care provider may prescribe medicine if lifestyle changes are not enough to get your blood pressure under control, and if:  Your systolic blood pressure is above 130.  Your diastolic blood pressure is above 80.  Your personal target blood pressure may vary depending on your medical conditions, your age, and other factors. Follow these instructions at home: Eating and drinking  Eat a diet that is high in fiber and potassium, and low in sodium, added sugar, and fat. An example eating plan is called the DASH (Dietary Approaches to Stop Hypertension) diet. To eat this way: ? Eat plenty of fresh fruits and vegetables. Try to fill half of your plate at each meal with fruits and vegetables. ? Eat whole grains, such as whole wheat pasta, brown rice, or whole grain bread. Fill about one quarter of your plate with whole grains. ? Eat or drink low-fat dairy products, such as skim milk or low-fat yogurt. ? Avoid fatty cuts of meat, processed or cured meats, and poultry with skin. Fill about one quarter of your plate with lean proteins, such as fish, chicken without skin, beans, eggs, and tofu. ? Avoid premade and processed foods. These tend to be higher in sodium, added sugar, and fat.  Reduce your daily sodium intake. Most people with hypertension should eat less than 1,500 mg of sodium a day.  Limit alcohol intake to no more than 1 drink a day for nonpregnant women and 2 drinks a day for men. One drink equals 12 oz of beer, 5 oz of wine, or 1 oz of hard liquor. Lifestyle  Work with your health care provider to maintain a healthy body weight or to lose weight. Ask what an ideal weight is for you.  Get at least 30 minutes of exercise that causes your heart to beat faster (aerobic exercise) most days of the week. Activities may include walking, swimming, or biking.  Include exercise to strengthen your muscles  (resistance exercise), such as pilates or lifting weights, as part of your weekly exercise routine. Try to do these types of exercises for 30 minutes at least 3 days a week.  Do not use any products that contain nicotine or tobacco, such as cigarettes and e-cigarettes. If you need help quitting, ask your health care provider.  Monitor your blood pressure at home as told by your health care provider.  Keep all follow-up visits as told by your health care provider. This is important. Medicines  Take over-the-counter and prescription medicines only as told by your health care provider. Follow directions carefully. Blood pressure medicines must be taken as prescribed.  Do not skip doses of blood pressure medicine. Doing this puts you at risk for problems and can make the medicine less effective.  Ask your health care provider about side effects or reactions to medicines that you should watch for. Contact a health care provider if:  You think you are having a reaction to a medicine you are taking.  You have headaches that keep coming back (recurring).  You feel dizzy.  You have swelling in your  ankles.  You have trouble with your vision. Get help right away if:  You develop a severe headache or confusion.  You have unusual weakness or numbness.  You feel faint.  You have severe pain in your chest or abdomen.  You vomit repeatedly.  You have trouble breathing. Summary  Hypertension is when the force of blood pumping through your arteries is too strong. If this condition is not controlled, it may put you at risk for serious complications.  Your personal target blood pressure may vary depending on your medical conditions, your age, and other factors. For most people, a normal blood pressure is less than 120/80.  Hypertension is treated with lifestyle changes, medicines, or a combination of both. Lifestyle changes include weight loss, eating a healthy, low-sodium diet, exercising  more, and limiting alcohol. This information is not intended to replace advice given to you by your health care provider. Make sure you discuss any questions you have with your health care provider. Document Released: 09/20/2005 Document Revised: 08/18/2016 Document Reviewed: 08/18/2016 Elsevier Interactive Patient Education  [employer].

## 2017-09-30 NOTE — Progress Notes (Signed)
MRN: [id number] DOB: 12-09-1945  Subjective:   Erica Thomas is a 71 y.o. female presenting for 2 week history of lower leg swelling. Patient was on a cruise, has been back for 10 days. Has also had shob while walking up one flight of stairs. Denies dizziness, chronic headache, chest pain, heart racing, palpitations, nausea, vomiting, abdominal pain, hematuria, lower leg swelling. In the past 2 weeks, patient reports 2 episodes of choking sensation, relieved with retrieving the foods from her mouth with her hand. Denies confusion, speech disturbance, difficulty controlling secretions, weakness, numbness or tingling. Patient has not been compliant with her medications since her PCP, Dr. Leward Quan, retired. Denies smoking cigarettes. Patient does not have a cardiologist, she did have full cardiac work up per patient in 2009. Testing was negative.  G[place] has a current medication list which includes the following prescription(s): glucolab glucose monitoring, glucose blood, lancets, atorvastatin, cyanocobalamin, ibuprofen, lisinopril-hydrochlorothiazide, multiple vitamin, and probiotic product. Also is allergic to penicillins.  [place]  has a past medical history of Arthritis, Blood transfusion without reported diagnosis, Cataract, Diabetes mellitus without complication ([place]), Hyperlipidemia, and Hypertension. Also  has a past surgical history that includes Appendectomy; Abdominal hysterectomy (1974); Colonoscopy; Tonsillectomy; and Spinal cord decompression (03/28/2013).  Objective:   Vitals: BP (!) 183/101 (BP Location: Right Arm, Patient Position: Sitting, Cuff Size: Large)   Pulse 96   Temp 98.7 F (37.1 C) (Oral)   Resp 18   Ht 5' 2.5" (1.588 m)   Wt 197 lb 3.2 oz (89.4 kg)   SpO2 96%   BMI 35.49 kg/m   BP Readings from Last 3 Encounters:  09/30/17 (!) 183/101  03/27/17 (!) 187/92  04/25/15 120/76    Wt Readings from Last 3 Encounters:  09/30/17 197 lb 3.2 oz (89.4 kg)  04/25/15 199 lb (90.3  kg)  02/22/15 201 lb (91.2 kg)    Physical Exam  Constitutional: She is oriented to person, place, and time. She appears well-developed and well-nourished.  HENT:  Mouth/Throat: Oropharynx is clear and moist.  Eyes: EOM are normal. Pupils are equal, round, and reactive to light.  Cardiovascular: Normal rate, regular rhythm and intact distal pulses. Exam reveals no gallop and no friction rub.  Murmur (systolic murmur best heard over RUSB) heard. Pulmonary/Chest: No respiratory distress. She has no wheezes. She has no rales.  Musculoskeletal: She exhibits edema (up to mid calves bilaterally).  Neurological: She is alert and oriented to person, place, and time. No cranial nerve deficit. Coordination normal.  Skin: Skin is warm and dry.  Psychiatric: She has a normal mood and affect.   Results for orders placed or performed in visit on 09/30/17 (from the past 24 hour(s))  POCT urinalysis dipstick     Status: Abnormal   Collection Time: 09/30/17  4:22 PM  Result Value Ref Range   Color, UA yellow yellow   Clarity, UA clear clear   Glucose, UA negative negative mg/dL   Bilirubin, UA negative negative   Ketones, POC UA trace (5) (A) negative mg/dL   Spec Grav, UA 1.025 1.010 - 1.025   Blood, UA negative negative   pH, UA 6.0 5.0 - 8.0   Protein Ur, POC =100 (A) negative mg/dL   Urobilinogen, UA 0.2 0.2 or 1.0 E.U./dL   Nitrite, UA Negative Negative   Leukocytes, UA Negative Negative     Assessment and Plan :   Essential hypertension - Plan: Comprehensive metabolic panel, Brain natriuretic peptide  Elevated blood pressure reading - Plan: Comprehensive  metabolic panel, Brain natriuretic peptide, POCT urinalysis dipstick  Peripheral edema - Plan: POCT urinalysis dipstick, CANCELED: POCT urinalysis dipstick  Choking due to food in larynx, initial encounter   Will start patient on Lasix short term for diuresis and to lower BP. Physical exam findings reassuring against acute  intra-cranial process. Strict ER and return-to-clinic precautions discussed, patient verbalized understanding. Recommended lifestyle modifications. Labs pending. Recheck on 10/03/2017.  [name], PA-C [facility] [phone] 09/30/2017  4:02 PM

## 2017-10-01 LAB — COMPREHENSIVE METABOLIC PANEL
A/G RATIO: 1.6 (ref 1.2–2.2)
ALK PHOS: 147 IU/L — AB (ref 39–117)
ALT: 14 IU/L (ref 0–32)
AST: 17 IU/L (ref 0–40)
Albumin: 4.1 g/dL (ref 3.5–4.8)
BUN/Creatinine Ratio: 22 (ref 12–28)
BUN: 22 mg/dL (ref 8–27)
Bilirubin Total: <0.2 mg/dL (ref 0.0–1.2)
CHLORIDE: 105 mmol/L (ref 96–106)
CO2: 23 mmol/L (ref 20–29)
Calcium: 9.4 mg/dL (ref 8.7–10.3)
Creatinine, Ser: 1.02 mg/dL — ABNORMAL HIGH (ref 0.57–1.00)
GFR calc Af Amer: 63 mL/min/{1.73_m2} (ref >59)
GFR calc non Af Amer: 55 mL/min/{1.73_m2} — ABNORMAL LOW (ref >59)
Globulin, Total: 2.5 g/dL (ref 1.5–4.5)
Glucose: 113 mg/dL — ABNORMAL HIGH (ref 65–99)
POTASSIUM: 4.6 mmol/L (ref 3.5–5.2)
Sodium: 143 mmol/L (ref 134–144)
Total Protein: 6.6 g/dL (ref 6.0–8.5)

## 2017-10-01 LAB — BRAIN NATRIURETIC PEPTIDE: BNP: 31.3 pg/mL (ref 0.0–100.0)

## 2017-10-03 ENCOUNTER — Ambulatory Visit: Payer: PPO | Admitting: Urgent Care

## 2017-10-03 ENCOUNTER — Ambulatory Visit (INDEPENDENT_AMBULATORY_CARE_PROVIDER_SITE_OTHER): Payer: PPO

## 2017-10-03 ENCOUNTER — Encounter: Payer: Self-pay | Admitting: Urgent Care

## 2017-10-03 VITALS — BP 160/78 | HR 112 | Temp 99.2°F | Resp 18 | Ht 62.5 in | Wt 189.4 lb

## 2017-10-03 DIAGNOSIS — R0602 Shortness of breath: Secondary | ICD-10-CM | POA: Diagnosis not present

## 2017-10-03 DIAGNOSIS — I10 Essential (primary) hypertension: Secondary | ICD-10-CM | POA: Diagnosis not present

## 2017-10-03 DIAGNOSIS — E119 Type 2 diabetes mellitus without complications: Secondary | ICD-10-CM

## 2017-10-03 DIAGNOSIS — R030 Elevated blood-pressure reading, without diagnosis of hypertension: Secondary | ICD-10-CM

## 2017-10-03 DIAGNOSIS — R05 Cough: Secondary | ICD-10-CM | POA: Diagnosis not present

## 2017-10-03 DIAGNOSIS — R059 Cough, unspecified: Secondary | ICD-10-CM

## 2017-10-03 DIAGNOSIS — R609 Edema, unspecified: Secondary | ICD-10-CM | POA: Diagnosis not present

## 2017-10-03 MED ORDER — BENZONATATE 100 MG PO CAPS: 100.0000 mg | ORAL_CAPSULE | Freq: Three times a day (TID) | ORAL | 0 refills | Status: DC | PRN

## 2017-10-03 MED ORDER — LISINOPRIL-HYDROCHLOROTHIAZIDE 20-25 MG PO TABS: 1.0000 | ORAL_TABLET | Freq: Every day | ORAL | 3 refills | Status: DC

## 2017-10-03 NOTE — Patient Instructions (Addendum)
Cough, Adult Coughing is a reflex that clears your throat and your airways. Coughing helps to heal and protect your lungs. It is normal to cough occasionally, but a cough that happens with other symptoms or lasts a long time may be a sign of a condition that needs treatment. A cough may last only 2-3 weeks (acute), or it may last longer than 8 weeks (chronic). What are the causes? Coughing is commonly caused by:  Breathing in substances that irritate your lungs.  A viral or bacterial respiratory infection.  Allergies.  Asthma.  Postnasal drip.  Smoking.  Acid backing up from the stomach into the esophagus (gastroesophageal reflux).  Certain medicines.  Chronic lung problems, including COPD (or rarely, lung cancer).  Other medical conditions such as heart failure.  Follow these instructions at home: Pay attention to any changes in your symptoms. Take these actions to help with your discomfort:  Take medicines only as told by your health care provider. ? If you were prescribed an antibiotic medicine, take it as told by your health care provider. Do not stop taking the antibiotic even if you start to feel better. ? Talk with your health care provider before you take a cough suppressant medicine.  Drink enough fluid to keep your urine clear or pale yellow.  If the air is dry, use a cold steam vaporizer or humidifier in your bedroom or your home to help loosen secretions.  Avoid anything that causes you to cough at work or at home.  If your cough is worse at night, try sleeping in a semi-upright position.  Avoid cigarette smoke. If you smoke, quit smoking. If you need help quitting, ask your health care provider.  Avoid caffeine.  Avoid alcohol.  Rest as needed.  Contact a health care provider if:  You have new symptoms.  You cough up pus.  Your cough does not get better after 2-3 weeks, or your cough gets worse.  You cannot control your cough with suppressant  medicines and you are losing sleep.  You develop pain that is getting worse or pain that is not controlled with pain medicines.  You have a fever.  You have unexplained weight loss.  You have night sweats. Get help right away if:  You cough up blood.  You have difficulty breathing.  Your heartbeat is very fast. This information is not intended to replace advice given to you by your health care provider. Make sure you discuss any questions you have with your health care provider. Document Released: 03/19/2011 Document Revised: 02/26/2016 Document Reviewed: 11/27/2014 Elsevier Interactive Patient Education  2018 [employer].     Hydrochlorothiazide, HCTZ; Lisinopril tablets What is this medicine? HYDROCHLOROTHIAZIDE; LISINOPRIL (hye droe klor oh THYE a zide; lyse IN oh pril) is a combination of a diuretic and an ACE inhibitor. It is used to treat high blood pressure. This medicine may be used for other purposes; ask your health care provider or pharmacist if you have questions. COMMON BRAND NAME(S): Prinzide, Zestoretic What should I tell my health care provider before I take this medicine? They need to know if you have any of these conditions: -bone marrow disease -decreased urine -heart or blood vessel disease -if you are on a special diet like a low salt diet -immune system problems, like lupus -kidney disease -liver disease -previous swelling of the tongue, face, or lips with difficulty breathing, difficulty swallowing, hoarseness, or tightening of the throat -recent heart attack or stroke -an unusual or allergic reaction to  lisinopril, hydrochlorothiazide, sulfa drugs, other medicines, insect venom, foods, dyes, or preservatives -pregnant or trying to get pregnant -breast-feeding How should I use this medicine? Take this medicine by mouth with a glass of water. Follow the directions on the prescription label. You can take it with or without food. If it upsets your  stomach, take it with food. Take your medicine at regular intervals. Do not take it more often than directed. Do not stop taking except on your doctor's advice. Talk to your pediatrician regarding the use of this medicine in children. Special care may be needed. Overdosage: If you think you have taken too much of this medicine contact a poison control center or emergency room at once. NOTE: This medicine is only for you. Do not share this medicine with others. What if I miss a dose? If you miss a dose, take it as soon as you can. If it is almost time for your next dose, take only that dose. Do not take double or extra doses. What may interact with this medicine? Do not take this medication with any of the following medications: -sacubitril; valsartan This medicine may also interact with the following: -barbiturates like phenobarbital -blood pressure medicines -corticosteroids like prednisone -diabetic medications -diuretics, especially triamterene, spironolactone or amiloride -lithium -NSAIDs, medicines for pain and inflammation, like ibuprofen or naproxen -potassium salts or potassium supplements -prescription pain medicines -skeletal muscle relaxants like tubocurarine -some cholesterol lowering medications like cholestyramine or colestipol This list may not describe all possible interactions. Give your health care provider a list of all the medicines, herbs, non-prescription drugs, or dietary supplements you use. Also tell them if you smoke, drink alcohol, or use illegal drugs. Some items may interact with your medicine. What should I watch for while using this medicine? Visit your doctor or health care professional for regular checks on your progress. Check your blood pressure as directed. Ask your doctor or health care professional what your blood pressure should be and when you should contact him or her. Call your doctor or health care professional if you notice an irregular or fast heart  beat. You must not get dehydrated. Ask your doctor or health care professional how much fluid you need to drink a day. Check with him or her if you get an attack of severe diarrhea, nausea and vomiting, or if you sweat a lot. The loss of too much body fluid can make it dangerous for you to take this medicine. Women should inform their doctor if they wish to become pregnant or think they might be pregnant. There is a potential for serious side effects to an unborn child. Talk to your health care professional or pharmacist for more information. You may get drowsy or dizzy. Do not drive, use machinery, or do anything that needs mental alertness until you know how this drug affects you. Do not stand or sit up quickly, especially if you are an older patient. This reduces the risk of dizzy or fainting spells. Alcohol can make you more drowsy and dizzy. Avoid alcoholic drinks. This medicine may affect your blood sugar level. If you have diabetes, check with your doctor or health care professional before changing the dose of your diabetic medicine. Avoid salt substitutes unless you are told otherwise by your doctor or health care professional. This medicine can make you more sensitive to the sun. Keep out of the sun. If you cannot avoid being in the sun, wear protective clothing and use sunscreen. Do not use sun lamps or  tanning beds/booths. Do not treat yourself for coughs, colds, or pain while you are taking this medicine without asking your doctor or health care professional for advice. Some ingredients may increase your blood pressure. What side effects may I notice from receiving this medicine? Side effects that you should report to your doctor or health care professional as soon as possible: -changes in vision -confusion, dizziness, light headedness or fainting spells -decreased amount of urine passed -difficulty breathing or swallowing, hoarseness, or tightening of the throat -eye pain -fast or  irregular heart beat, palpitations, or chest pain -muscle cramps -nausea and vomiting -persistent dry cough -redness, blistering, peeling or loosening of the skin, including inside the mouth -stomach pain -swelling of your face, lips, tongue, hands, or feet -unusual rash, bleeding or bruising, or pinpoint red spots on the skin -worsened gout pain -yellowing of the eyes or skin Side effects that usually do not require medical attention (report to your doctor or health care professional if they continue or are bothersome): -change in sex drive or performance -cough -headache This list may not describe all possible side effects. Call your doctor for medical advice about side effects. You may report side effects to FDA at 1-800-FDA-1088. Where should I keep my medicine? Keep out of the reach of children. Store at room temperature between 20 and 25 degrees C (68 and 77 degrees F). Protect from moisture and excessive light. Keep container tightly closed. Throw away any unused medicine after the expiration date. NOTE: This sheet is a summary. It may not cover all possible information. If you have questions about this medicine, talk to your doctor, pharmacist, or health care provider.  2018 Elsevier/Gold Standard (2015-11-14 11:42:20)     IF you received an x-ray today, you will receive an invoice from [facility]. Please contact [facility] at [phone] with questions or concerns regarding your invoice.   IF you received labwork today, you will receive an invoice from [facility]. Please contact [facility] at [phone] with questions or concerns regarding your invoice.   Our billing staff will not be able to assist you with questions regarding bills from these companies.  You will be contacted with the lab results as soon as they are available. The fastest way to get your results is to activate your My Chart account. Instructions are located on the last page of this  paperwork. If you have not heard from [place] regarding the results in 2 weeks, please contact this office.    '

## 2017-10-03 NOTE — Progress Notes (Signed)
    MRN: [id number] DOB: 26-Jun-1946  Subjective:   Erica Thomas is a 71 y.o. female presenting for follow up on Hypertension and peripheral edema. Last OV was 09/30/2017. Patient was started on Lasix and advised to follow up today. Reports that she has had significant improvement in her swelling. Still has some shob with increased physical activity. Denies dizziness, chronic headache, chest pain, heart racing, palpitations, nausea, vomiting, abdominal pain, hematuria, lower leg swelling. Denies smoking cigarettes or drinking alcohol.   Erica Thomas has a current medication list which includes the following prescription(s): furosemide. Also is allergic to penicillins.  Erica Thomas  has a past medical history of Arthritis, Blood transfusion without reported diagnosis, Cataract, Diabetes mellitus without complication ([place]), Hyperlipidemia, and Hypertension. Also  has a past surgical history that includes Appendectomy; Abdominal hysterectomy (1974); Colonoscopy; Tonsillectomy; and Spinal cord decompression (03/28/2013).  Objective:   Vitals: BP (!) 160/78   Pulse (!) 112   Temp 99.2 F (37.3 C) (Oral)   Resp 18   Ht 5' 2.5" (1.588 m)   Wt 189 lb 6.4 oz (85.9 kg)   SpO2 96%   BMI 34.09 kg/m   Wt Readings from Last 3 Encounters:  10/03/17 189 lb 6.4 oz (85.9 kg)  09/30/17 197 lb 3.2 oz (89.4 kg)  04/25/15 199 lb (90.3 kg)    Physical Exam  Constitutional: She is oriented to person, place, and time. She appears well-developed and well-nourished.  HENT:  Mouth/Throat: Oropharynx is clear and moist.  Cardiovascular: Normal rate, regular rhythm and intact distal pulses. Exam reveals no gallop and no friction rub.  No murmur heard. Pulmonary/Chest: No respiratory distress. She has no wheezes. She has no rales.  Neurological: She is alert and oriented to person, place, and time.  Skin: Skin is warm and dry.  Psychiatric: She has a normal mood and affect.   Dg Chest 2 View  Result Date:  10/03/2017 CLINICAL DATA:  Cough and shortness of breath. EXAM: CHEST  2 VIEW COMPARISON:  03/26/2013 FINDINGS: The heart size and mediastinal contours are within normal limits. Mild bilateral pulmonary parenchymal scarring noted. No evidence of pulmonary infiltrate or edema. No evidence of pleural effusion or pneumothorax. IMPRESSION: No active cardiopulmonary disease. Electronically Signed   By: Earle Gell M.D.   On: 10/03/2017 17:53    Assessment and Plan :   Essential hypertension - Plan: Potassium, Microalbumin / creatinine urine ratio, Ambulatory referral to Cardiology, CANCELED: Basic metabolic panel  Uncontrolled hypertension - Plan: Potassium, Microalbumin / creatinine urine ratio, Ambulatory referral to Cardiology, CANCELED: Basic metabolic panel  Elevated blood pressure reading - Plan: Potassium, Microalbumin / creatinine urine ratio, Ambulatory referral to Cardiology, CANCELED: Basic metabolic panel  Peripheral edema - Plan: Potassium, Ambulatory referral to Cardiology  Type 2 diabetes mellitus without complication, without long-term current use of insulin ([place]) - Plan: Hemoglobin A1c, Lipid panel, Ambulatory referral to Cardiology  Cough - Plan: DG Chest 2 View  Shortness of breath - Plan: DG Chest 2 View  Stop Lasix, start lisinopril-HCTZ. Maintain healthy diet. Labs from 09/30/2017 reviewed. Referral to cardiology pending. Recheck in 4 weeks.  [name], PA-C [facility] [phone] 10/03/2017  5:06 PM

## 2017-10-04 LAB — HEMOGLOBIN A1C
ESTIMATED AVERAGE GLUCOSE: 137 mg/dL
HEMOGLOBIN A1C: 6.4 % — AB (ref 4.8–5.6)

## 2017-10-04 LAB — BASIC METABOLIC PANEL
BUN / CREAT RATIO: 25 (ref 12–28)
BUN: 30 mg/dL — ABNORMAL HIGH (ref 8–27)
CALCIUM: 9.3 mg/dL (ref 8.7–10.3)
CHLORIDE: 97 mmol/L (ref 96–106)
CO2: 21 mmol/L (ref 20–29)
Creatinine, Ser: 1.2 mg/dL — ABNORMAL HIGH (ref 0.57–1.00)
GFR, EST AFRICAN AMERICAN: 52 mL/min/{1.73_m2} — AB (ref >59)
GFR, EST NON AFRICAN AMERICAN: 45 mL/min/{1.73_m2} — AB (ref >59)
Glucose: 101 mg/dL — ABNORMAL HIGH (ref 65–99)
POTASSIUM: 4.6 mmol/L (ref 3.5–5.2)
Sodium: 139 mmol/L (ref 134–144)

## 2017-10-04 LAB — MICROALBUMIN / CREATININE URINE RATIO
CREATININE, UR: 76.3 mg/dL
MICROALB/CREAT RATIO: 312.8 mg/g{creat} — AB (ref 0.0–30.0)
Microalbumin, Urine: 238.7 ug/mL

## 2017-10-06 ENCOUNTER — Encounter: Payer: Self-pay | Admitting: Urgent Care

## 2017-10-06 ENCOUNTER — Telehealth: Payer: Self-pay | Admitting: Urgent Care

## 2017-10-06 NOTE — Telephone Encounter (Signed)
PLEASE SCHEDULE FOLLOW UP IN 4 WEEKS WITH MAIN FOR HTN

## 2017-10-07 NOTE — Telephone Encounter (Signed)
Called pt to schedule a follow up. Scheduled for January

## 2017-10-20 DIAGNOSIS — E119 Type 2 diabetes mellitus without complications: Secondary | ICD-10-CM | POA: Diagnosis not present

## 2017-10-20 DIAGNOSIS — I10 Essential (primary) hypertension: Secondary | ICD-10-CM | POA: Diagnosis not present

## 2017-10-20 DIAGNOSIS — E7800 Pure hypercholesterolemia, unspecified: Secondary | ICD-10-CM | POA: Diagnosis not present

## 2017-10-20 DIAGNOSIS — Z794 Long term (current) use of insulin: Secondary | ICD-10-CM | POA: Diagnosis not present

## 2017-10-22 ENCOUNTER — Ambulatory Visit (INDEPENDENT_AMBULATORY_CARE_PROVIDER_SITE_OTHER): Payer: PPO | Admitting: Family Medicine

## 2017-10-22 DIAGNOSIS — E119 Type 2 diabetes mellitus without complications: Secondary | ICD-10-CM | POA: Diagnosis not present

## 2017-10-22 NOTE — Progress Notes (Signed)
Not seen by provider 

## 2017-10-23 LAB — LIPID PANEL
CHOLESTEROL TOTAL: 283 mg/dL — AB (ref 100–199)
Chol/HDL Ratio: 4.9 ratio — ABNORMAL HIGH (ref 0.0–4.4)
HDL: 58 mg/dL (ref >39)
LDL CALC: 194 mg/dL — AB (ref 0–99)
Triglycerides: 155 mg/dL — ABNORMAL HIGH (ref 0–149)
VLDL Cholesterol Cal: 31 mg/dL (ref 5–40)

## 2017-10-25 ENCOUNTER — Encounter: Payer: Self-pay | Admitting: Urgent Care

## 2017-10-25 ENCOUNTER — Other Ambulatory Visit: Payer: Self-pay | Admitting: Urgent Care

## 2017-10-25 MED ORDER — ATORVASTATIN CALCIUM 40 MG PO TABS: 40.0000 mg | ORAL_TABLET | Freq: Every day | ORAL | 3 refills | Status: DC

## 2017-10-27 ENCOUNTER — Encounter: Payer: Self-pay | Admitting: Urgent Care

## 2017-10-27 ENCOUNTER — Ambulatory Visit (INDEPENDENT_AMBULATORY_CARE_PROVIDER_SITE_OTHER): Payer: PPO | Admitting: Urgent Care

## 2017-10-27 VITALS — BP 140/80 | HR 83 | Temp 98.8°F | Resp 18 | Ht 62.5 in | Wt 191.4 lb

## 2017-10-27 DIAGNOSIS — R7989 Other specified abnormal findings of blood chemistry: Secondary | ICD-10-CM | POA: Diagnosis not present

## 2017-10-27 DIAGNOSIS — R030 Elevated blood-pressure reading, without diagnosis of hypertension: Secondary | ICD-10-CM | POA: Diagnosis not present

## 2017-10-27 DIAGNOSIS — R944 Abnormal results of kidney function studies: Secondary | ICD-10-CM

## 2017-10-27 DIAGNOSIS — I10 Essential (primary) hypertension: Secondary | ICD-10-CM

## 2017-10-27 NOTE — Patient Instructions (Addendum)
Managing Your Hypertension Hypertension is commonly called high blood pressure. This is when the force of your blood pressing against the walls of your arteries is too strong. Arteries are blood vessels that carry blood from your heart throughout your body. Hypertension forces the heart to work harder to pump blood, and may cause the arteries to become narrow or stiff. Having untreated or uncontrolled hypertension can cause heart attack, stroke, kidney disease, and other problems. What are blood pressure readings? A blood pressure reading consists of a higher number over a lower number. Ideally, your blood pressure should be below 120/80. The first ("top") number is called the systolic pressure. It is a measure of the pressure in your arteries as your heart beats. The second ("bottom") number is called the diastolic pressure. It is a measure of the pressure in your arteries as the heart relaxes. What does my blood pressure reading mean? Blood pressure is classified into four stages. Based on your blood pressure reading, your health care provider may use the following stages to determine what type of treatment you need, if any. Systolic pressure and diastolic pressure are measured in a unit called mm Hg. Normal  Systolic pressure: below 120.  Diastolic pressure: below 80. Elevated  Systolic pressure: 120-129.  Diastolic pressure: below 80. Hypertension stage 1  Systolic pressure: 130-139.  Diastolic pressure: 80-89. Hypertension stage 2  Systolic pressure: 140 or above.  Diastolic pressure: 90 or above. What health risks are associated with hypertension? Managing your hypertension is an important responsibility. Uncontrolled hypertension can lead to:  A heart attack.  A stroke.  A weakened blood vessel (aneurysm).  Heart failure.  Kidney damage.  Eye damage.  Metabolic syndrome.  Memory and concentration problems.  What changes can I make to manage my  hypertension? Hypertension can be managed by making lifestyle changes and possibly by taking medicines. Your health care provider will help you make a plan to bring your blood pressure within a normal range. Eating and drinking  Eat a diet that is high in fiber and potassium, and low in salt (sodium), added sugar, and fat. An example eating plan is called the DASH (Dietary Approaches to Stop Hypertension) diet. To eat this way: ? Eat plenty of fresh fruits and vegetables. Try to fill half of your plate at each meal with fruits and vegetables. ? Eat whole grains, such as whole wheat pasta, brown rice, or whole grain bread. Fill about one quarter of your plate with whole grains. ? Eat low-fat diary products. ? Avoid fatty cuts of meat, processed or cured meats, and poultry with skin. Fill about one quarter of your plate with lean proteins such as fish, chicken without skin, beans, eggs, and tofu. ? Avoid premade and processed foods. These tend to be higher in sodium, added sugar, and fat.  Reduce your daily sodium intake. Most people with hypertension should eat less than 1,500 mg of sodium a day.  Limit alcohol intake to no more than 1 drink a day for nonpregnant women and 2 drinks a day for men. One drink equals 12 oz of beer, 5 oz of wine, or 1 oz of hard liquor. Lifestyle  Work with your health care provider to maintain a healthy body weight, or to lose weight. Ask what an ideal weight is for you.  Get at least 30 minutes of exercise that causes your heart to beat faster (aerobic exercise) most days of the week. Activities may include walking, swimming, or biking.  Include exercise   to strengthen your muscles (resistance exercise), such as weight lifting, as part of your weekly exercise routine. Try to do these types of exercises for 30 minutes at least 3 days a week.  Do not use any products that contain nicotine or tobacco, such as cigarettes and e-cigarettes. If you need help quitting, ask  your health care provider.  Control any long-term (chronic) conditions you have, such as high cholesterol or diabetes. Monitoring  Monitor your blood pressure at home as told by your health care provider. Your personal target blood pressure may vary depending on your medical conditions, your age, and other factors.  Have your blood pressure checked regularly, as often as told by your health care provider. Working with your health care provider  Review all the medicines you take with your health care provider because there may be side effects or interactions.  Talk with your health care provider about your diet, exercise habits, and other lifestyle factors that may be contributing to hypertension.  Visit your health care provider regularly. Your health care provider can help you create and adjust your plan for managing hypertension. Will I need medicine to control my blood pressure? Your health care provider may prescribe medicine if lifestyle changes are not enough to get your blood pressure under control, and if:  Your systolic blood pressure is 130 or higher.  Your diastolic blood pressure is 80 or higher.  Take medicines only as told by your health care provider. Follow the directions carefully. Blood pressure medicines must be taken as prescribed. The medicine does not work as well when you skip doses. Skipping doses also puts you at risk for problems. Contact a health care provider if:  You think you are having a reaction to medicines you have taken.  You have repeated (recurrent) headaches.  You feel dizzy.  You have swelling in your ankles.  You have trouble with your vision. Get help right away if:  You develop a severe headache or confusion.  You have unusual weakness or numbness, or you feel faint.  You have severe pain in your chest or abdomen.  You vomit repeatedly.  You have trouble breathing. Summary  Hypertension is when the force of blood pumping through  your arteries is too strong. If this condition is not controlled, it may put you at risk for serious complications.  Your personal target blood pressure may vary depending on your medical conditions, your age, and other factors. For most people, a normal blood pressure is less than 120/80.  Hypertension is managed by lifestyle changes, medicines, or both. Lifestyle changes include weight loss, eating a healthy, low-sodium diet, exercising more, and limiting alcohol. This information is not intended to replace advice given to you by your health care provider. Make sure you discuss any questions you have with your health care provider. Document Released: 06/14/2012 Document Revised: 08/18/2016 Document Reviewed: 08/18/2016 Elsevier Interactive Patient Education  2018 [employer]    IF you received an x-ray today, you will receive an invoice from [facility]. Please contact [facility] at [phone] with questions or concerns regarding your invoice.   IF you received labwork today, you will receive an invoice from [facility]. Please contact [facility] at [phone] with questions or concerns regarding your invoice.   Our billing staff will not be able to assist you with questions regarding bills from these companies.  You will be contacted with the lab results as soon as they are available. The fastest way to get your results is   to activate your My Chart account. Instructions are located on the last page of this paperwork. If you have not heard from us regarding the results in 2 weeks, please contact this office.      

## 2017-10-27 NOTE — Progress Notes (Signed)
   MRN: [id number] DOB: Mar 01, 1946  Subjective:   Erica Thomas is a 72 y.o. female presenting for follow up on Hypertension. Currently managed with lis-HCTZ, Lasix and metoprolol. Patient was seen by [facility] and recommend she use this regimen as she is retaining fluids. Admits that she had myalgia in her lower legs, ankles yesterday but not today. Denies dizziness, chronic headache, chest pain, shortness of breath, heart racing, palpitations, nausea, vomiting, abdominal pain, hematuria, lower leg swelling. Denies smoking cigarettes or drinking alcohol.   Erica Thomas has a current medication list which includes the following prescription(s): atorvastatin, furosemide, lisinopril-hydrochlorothiazide, and metoprolol succinate. Also is allergic to penicillins.  Erica Thomas  has a past medical history of Arthritis, Blood transfusion without reported diagnosis, Cataract, Diabetes mellitus without complication ([place]), Hyperlipidemia, and Hypertension. Also  has a past surgical history that includes Appendectomy; Abdominal hysterectomy (1974); Colonoscopy; Tonsillectomy; and Spinal cord decompression (03/28/2013).  Objective:   Vitals: BP 140/80   Pulse 83   Temp 98.8 F (37.1 C) (Oral)   Resp 18   Ht 5' 2.5" (1.588 m)   Wt 191 lb 6.4 oz (86.8 kg)   SpO2 96%   BMI 34.45 kg/m   The 10-year ASCVD risk score Mikey Bussing DC Jr., et al., 2013) is: 37.1%   Values used to calculate the score:     Age: 49 years     Sex: Female     Is Non-Hispanic African American: No     Diabetic: Yes     Tobacco smoker: No     Systolic Blood Pressure: 034 mmHg     Is BP treated: Yes     HDL Cholesterol: 58 mg/dL     Total Cholesterol: 283 mg/dL   Physical Exam  Constitutional: She is oriented to person, place, and time. She appears well-developed and well-nourished.  HENT:  Mouth/Throat: Oropharynx is clear and moist.  Cardiovascular: Normal rate, regular rhythm and intact distal pulses. Exam reveals no gallop and no  friction rub.  No murmur heard. No carotid bruits.  Pulmonary/Chest: No respiratory distress. She has no wheezes. She has no rales.  Musculoskeletal: She exhibits no edema or tenderness (or erythema).  Neurological: She is alert and oriented to person, place, and time.  Skin: No erythema.  Psychiatric: She has a normal mood and affect.   Assessment and Plan :   Essential hypertension - Plan: Basic metabolic panel, Thyroid Panel With TSH  Elevated blood pressure reading - Plan: Basic metabolic panel, Thyroid Panel With TSH  Labs pending, will recheck her potassium, creatinine levels.  [name], PA-C [facility] [phone] 10/27/2017  4:09 PM   UPDATE: CrCl calculated at 78mL/min. She is to maintain current regimen as prescribed by Dr. Einar Gip. I will place referral to nephrology for consult on CKD.

## 2017-10-28 LAB — BASIC METABOLIC PANEL
BUN/Creatinine Ratio: 38 — ABNORMAL HIGH (ref 12–28)
BUN: 54 mg/dL — AB (ref 8–27)
CHLORIDE: 102 mmol/L (ref 96–106)
CO2: 24 mmol/L (ref 20–29)
Calcium: 9.3 mg/dL (ref 8.7–10.3)
Creatinine, Ser: 1.42 mg/dL — ABNORMAL HIGH (ref 0.57–1.00)
GFR, EST AFRICAN AMERICAN: 42 mL/min/{1.73_m2} — AB (ref >59)
GFR, EST NON AFRICAN AMERICAN: 37 mL/min/{1.73_m2} — AB (ref >59)
Glucose: 116 mg/dL — ABNORMAL HIGH (ref 65–99)
Potassium: 4.4 mmol/L (ref 3.5–5.2)
Sodium: 142 mmol/L (ref 134–144)

## 2017-10-28 LAB — THYROID PANEL WITH TSH
FREE THYROXINE INDEX: 2 (ref 1.2–4.9)
T3 UPTAKE RATIO: 26 % (ref 24–39)
T4, Total: 7.6 ug/dL (ref 4.5–12.0)
TSH: 1.16 u[IU]/mL (ref 0.450–4.500)

## 2017-10-31 ENCOUNTER — Telehealth: Payer: Self-pay | Admitting: Urgent Care

## 2017-10-31 NOTE — Telephone Encounter (Signed)
Copied from [place] [id number]. Topic: Quick Communication - See Telephone Encounter >> Oct 31, 2017  4:55 PM Cleaster Corin, NT wrote: CRM for notification. See Telephone encounter for:   10/31/17. Pt. Calling to make sure that Dr. Bess Harvest renal specialist consult she hasn't heard anything as of yet. Pt. Can be reached at [phone]

## 2017-11-01 NOTE — Telephone Encounter (Signed)
Spoke with pt and let her know it takes at least a week to process her referral and she verbalized understanding. She stated she was just wondering if she can get an appointment for February 6th anytime after 11:30am. She wanted to try and see the cardiologist and nephrologist in the same day. I told her it depends on the availability at the nephrology office if we can make that happen, but none the less we will call her and let her know.

## 2017-11-02 NOTE — Telephone Encounter (Signed)
Called pt and gave her [place] [facility] information for her nephrology referral for her to call and sch.

## 2017-11-09 DIAGNOSIS — R0609 Other forms of dyspnea: Secondary | ICD-10-CM | POA: Diagnosis not present

## 2017-11-09 DIAGNOSIS — R011 Cardiac murmur, unspecified: Secondary | ICD-10-CM | POA: Diagnosis not present

## 2017-11-09 DIAGNOSIS — I10 Essential (primary) hypertension: Secondary | ICD-10-CM | POA: Diagnosis not present

## 2017-11-17 DIAGNOSIS — I10 Essential (primary) hypertension: Secondary | ICD-10-CM | POA: Diagnosis not present

## 2017-11-17 DIAGNOSIS — R0609 Other forms of dyspnea: Secondary | ICD-10-CM | POA: Diagnosis not present

## 2017-11-17 DIAGNOSIS — E1122 Type 2 diabetes mellitus with diabetic chronic kidney disease: Secondary | ICD-10-CM | POA: Diagnosis not present

## 2017-11-17 DIAGNOSIS — N183 Chronic kidney disease, stage 3 (moderate): Secondary | ICD-10-CM | POA: Diagnosis not present

## 2017-11-21 ENCOUNTER — Ambulatory Visit: Payer: PPO

## 2017-12-02 ENCOUNTER — Encounter: Payer: Self-pay | Admitting: Urgent Care

## 2017-12-02 DIAGNOSIS — E119 Type 2 diabetes mellitus without complications: Secondary | ICD-10-CM | POA: Diagnosis not present

## 2017-12-02 DIAGNOSIS — R55 Syncope and collapse: Secondary | ICD-10-CM | POA: Diagnosis not present

## 2017-12-02 DIAGNOSIS — I10 Essential (primary) hypertension: Secondary | ICD-10-CM | POA: Diagnosis not present

## 2018-01-27 ENCOUNTER — Encounter: Payer: Self-pay | Admitting: Urgent Care

## 2018-01-27 ENCOUNTER — Ambulatory Visit (INDEPENDENT_AMBULATORY_CARE_PROVIDER_SITE_OTHER): Payer: PPO | Admitting: Urgent Care

## 2018-01-27 VITALS — BP 149/80 | HR 83 | Temp 97.9°F | Resp 16 | Ht 62.5 in | Wt 199.2 lb

## 2018-01-27 DIAGNOSIS — I10 Essential (primary) hypertension: Secondary | ICD-10-CM

## 2018-01-27 DIAGNOSIS — Z1211 Encounter for screening for malignant neoplasm of colon: Secondary | ICD-10-CM

## 2018-01-27 DIAGNOSIS — R5383 Other fatigue: Secondary | ICD-10-CM

## 2018-01-27 DIAGNOSIS — E2839 Other primary ovarian failure: Secondary | ICD-10-CM

## 2018-01-27 DIAGNOSIS — Z1159 Encounter for screening for other viral diseases: Secondary | ICD-10-CM | POA: Diagnosis not present

## 2018-01-27 DIAGNOSIS — E119 Type 2 diabetes mellitus without complications: Secondary | ICD-10-CM | POA: Diagnosis not present

## 2018-01-27 LAB — POCT GLYCOSYLATED HEMOGLOBIN (HGB A1C): HEMOGLOBIN A1C: 7.4

## 2018-01-27 MED ORDER — AMLODIPINE BESYLATE 5 MG PO TABS: 5.0000 mg | ORAL_TABLET | Freq: Every day | ORAL | 3 refills | Status: DC

## 2018-01-27 MED ORDER — LISINOPRIL 20 MG PO TABS: 20.0000 mg | ORAL_TABLET | Freq: Every day | ORAL | 3 refills | Status: DC

## 2018-01-27 NOTE — Progress Notes (Signed)
   MRN: [id number] DOB: 08-29-1946  Subjective:   Erica Thomas is a 72 y.o. female presenting for follow up on Hypertension. Currently managed with amlodipine, lisinopril. Avoids salt in diet.  Erica Thomas has previously had problems with peripheral edema due to salt intake.  This was resolved with use of short-term Lasix.  Denies dizziness, chronic headache, blurred vision, chest pain, shortness of breath, heart racing, palpitations, nausea, vomiting, abdominal pain, hematuria, lower leg swelling. Denies smoking cigarettes or drinking alcohol.   Erica Thomas has a current medication list which includes the following prescription(s): atorvastatin, furosemide, lisinopril-hydrochlorothiazide, and metoprolol succinate. Also is allergic to penicillins.  Erica Thomas  has a past medical history of Arthritis, Blood transfusion without reported diagnosis, Cataract, Diabetes mellitus without complication ([place]), Hyperlipidemia, and Hypertension. Also  has a past surgical history that includes Appendectomy; Abdominal hysterectomy (1974); Colonoscopy; Tonsillectomy; and Spinal cord decompression (03/28/2013).  Objective:   Vitals: BP (!) 149/80   Pulse 83   Temp 97.9 F (36.6 C) (Oral)   Resp 16   Ht 5' 2.5" (1.588 m)   Wt 199 lb 3.2 oz (90.4 kg)   SpO2 97%   BMI 35.85 kg/m   Physical Exam  Constitutional: Erica Thomas is oriented to person, place, and time. Erica Thomas appears well-developed and well-nourished.  HENT:  Mouth/Throat: Oropharynx is clear and moist.  Eyes: No scleral icterus.  Neck: Normal range of motion. Neck supple. No thyromegaly present.  Cardiovascular: Normal rate, regular rhythm and intact distal pulses. Exam reveals no gallop and no friction rub.  No murmur heard. Pulmonary/Chest: No respiratory distress. Erica Thomas has no wheezes. Erica Thomas has no rales.  Abdominal: Soft. Bowel sounds are normal. Erica Thomas exhibits no distension and no mass. There is no tenderness. There is no rebound and no guarding.  Musculoskeletal: Erica Thomas  exhibits no edema.  Neurological: Erica Thomas is alert and oriented to person, place, and time.  Skin: Skin is warm and dry.  Psychiatric: Erica Thomas has a normal mood and affect.   Lab Results  Component Value Date   HGBA1C 7.4 01/27/2018   Assessment and Plan :   Essential hypertension - Plan: Basic metabolic panel, Lipid panel, Thyroid Panel With TSH  HTN, goal below 140/80  Type 2 diabetes mellitus without complication, without long-term current use of insulin (HCC) - Plan: Thyroid Panel With TSH, POCT glycosylated hemoglobin (Hb A1C), CANCELED: HM Diabetes Foot Exam, CANCELED: Hemoglobin A1c  Screen for colon cancer - Plan: Ambulatory referral to Gastroenterology  Estrogen deficiency - Plan: DG Bone Density  Encounter for hepatitis C screening test for low risk patient - Plan: Hepatitis C antibody  Other fatigue - Plan: Thyroid Panel With TSH  Patient's HTN medications refilled.  Erica Thomas is doing well, labs pending.  Referral to GI pending.  Patient would like to schedule her own mammogram.  Order placed for a bone scan.  Follow-up in 6 months for recheck on her blood pressure and cholesterol.  Okay to refill medications up to 1 year.  [name], PA-C [facility] [phone] 01/27/2018  5:17 PM

## 2018-01-27 NOTE — Patient Instructions (Signed)
Managing Your Hypertension Hypertension is commonly called high blood pressure. This is when the force of your blood pressing against the walls of your arteries is too strong. Arteries are blood vessels that carry blood from your heart throughout your body. Hypertension forces the heart to work harder to pump blood, and may cause the arteries to become narrow or stiff. Having untreated or uncontrolled hypertension can cause heart attack, stroke, kidney disease, and other problems. What are blood pressure readings? A blood pressure reading consists of a higher number over a lower number. Ideally, your blood pressure should be below 120/80. The first ("top") number is called the systolic pressure. It is a measure of the pressure in your arteries as your heart beats. The second ("bottom") number is called the diastolic pressure. It is a measure of the pressure in your arteries as the heart relaxes. What does my blood pressure reading mean? Blood pressure is classified into four stages. Based on your blood pressure reading, your health care provider may use the following stages to determine what type of treatment you need, if any. Systolic pressure and diastolic pressure are measured in a unit called mm Hg. Normal  Systolic pressure: below 120.  Diastolic pressure: below 80. Elevated  Systolic pressure: 120-129.  Diastolic pressure: below 80. Hypertension stage 1  Systolic pressure: 130-139.  Diastolic pressure: 80-89. Hypertension stage 2  Systolic pressure: 140 or above.  Diastolic pressure: 90 or above. What health risks are associated with hypertension? Managing your hypertension is an important responsibility. Uncontrolled hypertension can lead to:  A heart attack.  A stroke.  A weakened blood vessel (aneurysm).  Heart failure.  Kidney damage.  Eye damage.  Metabolic syndrome.  Memory and concentration problems.  What changes can I make to manage my  hypertension? Hypertension can be managed by making lifestyle changes and possibly by taking medicines. Your health care provider will help you make a plan to bring your blood pressure within a normal range. Eating and drinking  Eat a diet that is high in fiber and potassium, and low in salt (sodium), added sugar, and fat. An example eating plan is called the DASH (Dietary Approaches to Stop Hypertension) diet. To eat this way: ? Eat plenty of fresh fruits and vegetables. Try to fill half of your plate at each meal with fruits and vegetables. ? Eat whole grains, such as whole wheat pasta, brown rice, or whole grain bread. Fill about one quarter of your plate with whole grains. ? Eat low-fat diary products. ? Avoid fatty cuts of meat, processed or cured meats, and poultry with skin. Fill about one quarter of your plate with lean proteins such as fish, chicken without skin, beans, eggs, and tofu. ? Avoid premade and processed foods. These tend to be higher in sodium, added sugar, and fat.  Reduce your daily sodium intake. Most people with hypertension should eat less than 1,500 mg of sodium a day.  Limit alcohol intake to no more than 1 drink a day for nonpregnant women and 2 drinks a day for men. One drink equals 12 oz of beer, 5 oz of wine, or 1 oz of hard liquor. Lifestyle  Work with your health care provider to maintain a healthy body weight, or to lose weight. Ask what an ideal weight is for you.  Get at least 30 minutes of exercise that causes your heart to beat faster (aerobic exercise) most days of the week. Activities may include walking, swimming, or biking.  Include exercise   to strengthen your muscles (resistance exercise), such as weight lifting, as part of your weekly exercise routine. Try to do these types of exercises for 30 minutes at least 3 days a week.  Do not use any products that contain nicotine or tobacco, such as cigarettes and e-cigarettes. If you need help quitting, ask  your health care provider.  Control any long-term (chronic) conditions you have, such as high cholesterol or diabetes. Monitoring  Monitor your blood pressure at home as told by your health care provider. Your personal target blood pressure may vary depending on your medical conditions, your age, and other factors.  Have your blood pressure checked regularly, as often as told by your health care provider. Working with your health care provider  Review all the medicines you take with your health care provider because there may be side effects or interactions.  Talk with your health care provider about your diet, exercise habits, and other lifestyle factors that may be contributing to hypertension.  Visit your health care provider regularly. Your health care provider can help you create and adjust your plan for managing hypertension. Will I need medicine to control my blood pressure? Your health care provider may prescribe medicine if lifestyle changes are not enough to get your blood pressure under control, and if:  Your systolic blood pressure is 130 or higher.  Your diastolic blood pressure is 80 or higher.  Take medicines only as told by your health care provider. Follow the directions carefully. Blood pressure medicines must be taken as prescribed. The medicine does not work as well when you skip doses. Skipping doses also puts you at risk for problems. Contact a health care provider if:  You think you are having a reaction to medicines you have taken.  You have repeated (recurrent) headaches.  You feel dizzy.  You have swelling in your ankles.  You have trouble with your vision. Get help right away if:  You develop a severe headache or confusion.  You have unusual weakness or numbness, or you feel faint.  You have severe pain in your chest or abdomen.  You vomit repeatedly.  You have trouble breathing. Summary  Hypertension is when the force of blood pumping through  your arteries is too strong. If this condition is not controlled, it may put you at risk for serious complications.  Your personal target blood pressure may vary depending on your medical conditions, your age, and other factors. For most people, a normal blood pressure is less than 120/80.  Hypertension is managed by lifestyle changes, medicines, or both. Lifestyle changes include weight loss, eating a healthy, low-sodium diet, exercising more, and limiting alcohol. This information is not intended to replace advice given to you by your health care provider. Make sure you discuss any questions you have with your health care provider. Document Released: 06/14/2012 Document Revised: 08/18/2016 Document Reviewed: 08/18/2016 Elsevier Interactive Patient Education  2018 Elsevier [employer].    IF you received an x-ray today, you will receive an invoice from [facility]. Please contact [facility] at [phone] with questions or concerns regarding your invoice.   IF you received labwork today, you will receive an invoice from [facility]. Please contact [facility] at [phone] with questions or concerns regarding your invoice.   Our billing staff will not be able to assist you with questions regarding bills from these companies.  You will be contacted with the lab results as soon as they are available. The fastest way to get your results is   to activate your My Chart account. Instructions are located on the last page of this paperwork. If you have not heard from us regarding the results in 2 weeks, please contact this office.      

## 2018-01-28 LAB — BASIC METABOLIC PANEL
BUN/Creatinine Ratio: 33 — ABNORMAL HIGH (ref 12–28)
BUN: 35 mg/dL — AB (ref 8–27)
CALCIUM: 9.5 mg/dL (ref 8.7–10.3)
CHLORIDE: 100 mmol/L (ref 96–106)
CO2: 25 mmol/L (ref 20–29)
CREATININE: 1.07 mg/dL — AB (ref 0.57–1.00)
GFR, EST AFRICAN AMERICAN: 60 mL/min/{1.73_m2} (ref >59)
GFR, EST NON AFRICAN AMERICAN: 52 mL/min/{1.73_m2} — AB (ref >59)
Glucose: 122 mg/dL — ABNORMAL HIGH (ref 65–99)
Potassium: 4.5 mmol/L (ref 3.5–5.2)
Sodium: 140 mmol/L (ref 134–144)

## 2018-01-28 LAB — LIPID PANEL
Chol/HDL Ratio: 3.5 ratio (ref 0.0–4.4)
Cholesterol, Total: 169 mg/dL (ref 100–199)
HDL: 48 mg/dL (ref >39)
LDL Calculated: 67 mg/dL (ref 0–99)
Triglycerides: 269 mg/dL — ABNORMAL HIGH (ref 0–149)
VLDL Cholesterol Cal: 54 mg/dL — ABNORMAL HIGH (ref 5–40)

## 2018-01-28 LAB — HEPATITIS C ANTIBODY

## 2018-01-31 ENCOUNTER — Encounter: Payer: Self-pay | Admitting: Urgent Care

## 2018-02-08 DIAGNOSIS — N183 Chronic kidney disease, stage 3 (moderate): Secondary | ICD-10-CM | POA: Diagnosis not present

## 2018-02-08 DIAGNOSIS — I129 Hypertensive chronic kidney disease with stage 1 through stage 4 chronic kidney disease, or unspecified chronic kidney disease: Secondary | ICD-10-CM | POA: Diagnosis not present

## 2018-02-08 DIAGNOSIS — R600 Localized edema: Secondary | ICD-10-CM | POA: Diagnosis not present

## 2018-02-13 ENCOUNTER — Other Ambulatory Visit: Payer: Self-pay | Admitting: Nephrology

## 2018-02-13 DIAGNOSIS — I129 Hypertensive chronic kidney disease with stage 1 through stage 4 chronic kidney disease, or unspecified chronic kidney disease: Secondary | ICD-10-CM

## 2018-02-13 DIAGNOSIS — N183 Chronic kidney disease, stage 3 unspecified: Secondary | ICD-10-CM

## 2018-02-14 ENCOUNTER — Other Ambulatory Visit: Payer: PPO

## 2018-02-16 DIAGNOSIS — N183 Chronic kidney disease, stage 3 (moderate): Secondary | ICD-10-CM | POA: Diagnosis not present

## 2018-02-16 DIAGNOSIS — I10 Essential (primary) hypertension: Secondary | ICD-10-CM | POA: Diagnosis not present

## 2018-02-16 DIAGNOSIS — E1122 Type 2 diabetes mellitus with diabetic chronic kidney disease: Secondary | ICD-10-CM | POA: Diagnosis not present

## 2018-02-16 DIAGNOSIS — R0609 Other forms of dyspnea: Secondary | ICD-10-CM | POA: Diagnosis not present

## 2018-02-20 ENCOUNTER — Ambulatory Visit
Admission: RE | Admit: 2018-02-20 | Discharge: 2018-02-20 | Disposition: A | Discharge status: Home / Self Care | Payer: PPO | Source: Ambulatory Visit | Attending: Nephrology | Admitting: Nephrology

## 2018-02-20 DIAGNOSIS — N183 Chronic kidney disease, stage 3 unspecified: Secondary | ICD-10-CM

## 2018-02-20 DIAGNOSIS — I129 Hypertensive chronic kidney disease with stage 1 through stage 4 chronic kidney disease, or unspecified chronic kidney disease: Secondary | ICD-10-CM

## 2018-02-20 DIAGNOSIS — N189 Chronic kidney disease, unspecified: Secondary | ICD-10-CM | POA: Diagnosis not present

## 2018-03-02 ENCOUNTER — Encounter: Payer: Self-pay | Admitting: Urgent Care

## 2018-03-02 ENCOUNTER — Ambulatory Visit (INDEPENDENT_AMBULATORY_CARE_PROVIDER_SITE_OTHER): Payer: PPO | Admitting: Urgent Care

## 2018-03-02 ENCOUNTER — Other Ambulatory Visit: Payer: Self-pay

## 2018-03-02 VITALS — BP 118/60 | HR 92 | Temp 98.2°F | Ht 62.5 in | Wt 200.4 lb

## 2018-03-02 DIAGNOSIS — R609 Edema, unspecified: Secondary | ICD-10-CM

## 2018-03-02 DIAGNOSIS — E119 Type 2 diabetes mellitus without complications: Secondary | ICD-10-CM | POA: Diagnosis not present

## 2018-03-02 DIAGNOSIS — M109 Gout, unspecified: Secondary | ICD-10-CM

## 2018-03-02 DIAGNOSIS — I10 Essential (primary) hypertension: Secondary | ICD-10-CM | POA: Diagnosis not present

## 2018-03-02 MED ORDER — GLIPIZIDE 5 MG PO TABS: 5.0000 mg | ORAL_TABLET | Freq: Every day | ORAL | 1 refills | Status: DC

## 2018-03-02 NOTE — Progress Notes (Signed)
MRN: [id number] DOB: Sep 28, 1946  Subjective:   Erica Thomas is a 72 y.o. female presenting for follow up on 4 week history of peripheral edema. Patient was recently seen by her nephrologist 02/08/2018 and cardiologist on 02/23/2018. She was started back on Lasix 40mg  for her swelling of her lower legs by her nephrologist. Uric acid was 8.6 at the time and had acute pain of her left toe with redness and swelling. She was started on allopurinol to help with her uric acid level while on Lasix.  She subsequently saw her cardiologist and despite her persistent lower leg swelling, had her amlodipine increased from 5 mg to 10 mg.  Today, patient denies headaches, dizziness, chest pain, shortness of breath, heart racing, palpitations, nausea, vomiting, belly pain, redness of her legs.  She also denies warmth, calf pain.  Denies smoking cigarettes.  Patient works in a sedentary position.  She does not use compression stockings.  At her last office visit with me on 01/27/2018, she was found to have an A1c level of 7.6%.  She has not started any diabetes medications but has been actively trying to work on [employer].  She avoids salt, does not actively avoid carbohydrates.  Erica Thomas has a current medication list which includes the following prescription(s): amlodipine, atorvastatin, furosemide, lisinopril, and metoprolol succinate. Also is allergic to penicillins.  Erica Thomas  has a past medical history of Arthritis, Blood transfusion without reported diagnosis, Cataract, Diabetes mellitus without complication ([place]), Hyperlipidemia, and Hypertension. Also  has a past surgical history that includes Appendectomy; Abdominal hysterectomy (1974); Colonoscopy; Tonsillectomy; and Spinal cord decompression (03/28/2013).  Objective:   Vitals: BP 118/60 (BP Location: Right Arm, Patient Position: Sitting, Cuff Size: Normal)   Pulse 92   Temp 98.2 F (36.8 C) (Oral)   Ht 5' 2.5" (1.588 m)   Wt 200 lb 6.4 oz (90.9 kg)   SpO2 97%    BMI 36.07 kg/m   The 10-year ASCVD risk score Mikey Bussing DC Jr., et al., 2013) is: 26.2%   Values used to calculate the score:     Age: 24 years     Sex: Female     Is Non-Hispanic African American: No     Diabetic: Yes     Tobacco smoker: No     Systolic Blood Pressure: 431 mmHg     Is BP treated: Yes     HDL Cholesterol: 48 mg/dL     Total Cholesterol: 169 mg/dL   Physical Exam  Constitutional: She is oriented to person, place, and time. She appears well-developed and well-nourished.  Cardiovascular: Normal rate.  Pulmonary/Chest: Effort normal.  Musculoskeletal: She exhibits edema (up to lower portion of calf bilaterally with tenderness around the ankle but no erythema, warmth).  Neurological: She is alert and oriented to person, place, and time.   Assessment and Plan :   Essential hypertension - Plan: Basic metabolic panel  HTN, goal below 140/80  Peripheral edema - Plan: Basic metabolic panel  Gout, unspecified cause, unspecified chronicity, unspecified site - Plan: Uric Acid  Type 2 diabetes mellitus without complication, without long-term current use of insulin (HCC)  Due to all the recent changes in her medications, I will maintain all her dosing especially since they were managed by specialists including her nephrologist and cardiologist.  Patient is in agreement with this plan.  I will have her start glipizide 5 mg for her diabetes.  She cannot take metformin due to her kidney function.  Maintain healthy lifestyle.  Patient  will recheck with me in 1 week.  In the meantime she is to use compression stockings and prop her feet up wherever she can.  If her lower leg swelling persists, patient is in agreement to stop her amlodipine as her lower leg swelling is quite profound.  Erica Eagles, PA-C [facility] [phone] 03/02/2018  4:11 PM

## 2018-03-02 NOTE — Patient Instructions (Addendum)
Wear compression stockings with at least 53mmHg of pressure. Keep taking your medications as they are now. We will see you back in one week and decide about amlodipine then based off of your swelling.    Please make sure that you are eating less or limit your carbohydrates such as white rice, potatoes, white breads, pasta, pizza, sodas, beer, sweet tea, fruit juices. Exercise can also help with your blood sugar. You can instead eat more salads, fruits, vegetables, whole grains, wheat, oats.       Salads - kale, spinach, cabbage, spring mix; use seeds like pumpkin seeds or sunflower seeds; you can also use 1-2 hard boiled eggs. Fruits - avocadoes, berries (blueberries, raspberries, blackberries), apples, oranges, pomegranate, grapefruit Seeds - quinoa, chia seeds, flax seed Vegetables - aspargus, cauliflower, broccoli, green beans, brussel spouts, bell peppers; stay away from starchy vegetables like potatoes, carrots, peas     Diabetes Mellitus and Nutrition When you have diabetes (diabetes mellitus), it is very important to have healthy eating habits because your blood sugar (glucose) levels are greatly affected by what you eat and drink. Eating healthy foods in the appropriate amounts, at about the same times every day, can help you:  Control your blood glucose.  Lower your risk of heart disease.  Improve your blood pressure.  Reach or maintain a healthy weight.  Every person with diabetes is different, and each person has different needs for a meal plan. Your health care provider may recommend that you work with a diet and nutrition specialist (dietitian) to make a meal plan that is best for you. Your meal plan may vary depending on factors such as:  The calories you need.  The medicines you take.  Your weight.  Your blood glucose, blood pressure, and cholesterol levels.  Your activity level.  Other health conditions you have, such as heart or kidney disease.  How do  carbohydrates affect me? Carbohydrates affect your blood glucose level more than any other type of food. Eating carbohydrates naturally increases the amount of glucose in your blood. Carbohydrate counting is a method for keeping track of how many carbohydrates you eat. Counting carbohydrates is important to keep your blood glucose at a healthy level, especially if you use insulin or take certain oral diabetes medicines. It is important to know how many carbohydrates you can safely have in each meal. This is different for every person. Your dietitian can help you calculate how many carbohydrates you should have at each meal and for snack. Foods that contain carbohydrates include:  Bread, cereal, rice, pasta, and crackers.  Potatoes and corn.  Peas, beans, and lentils.  Milk and yogurt.  Fruit and juice.  Desserts, such as cakes, cookies, ice cream, and candy.  How does alcohol affect me? Alcohol can cause a sudden decrease in blood glucose (hypoglycemia), especially if you use insulin or take certain oral diabetes medicines. Hypoglycemia can be a life-threatening condition. Symptoms of hypoglycemia (sleepiness, dizziness, and confusion) are similar to symptoms of having too much alcohol. If your health care provider says that alcohol is safe for you, follow these guidelines:  Limit alcohol intake to no more than 1 drink per day for nonpregnant women and 2 drinks per day for men. One drink equals 12 oz of beer, 5 oz of wine, or 1 oz of hard liquor.  Do not drink on an empty stomach.  Keep yourself hydrated with water, diet soda, or unsweetened iced tea.  Keep in mind that regular soda, juice,  and other mixers may contain a lot of sugar and must be counted as carbohydrates.  What are tips for following this plan? Reading food labels  Start by checking the serving size on the label. The amount of calories, carbohydrates, fats, and other nutrients listed on the label are based on one  serving of the food. Many foods contain more than one serving per package.  Check the total grams (g) of carbohydrates in one serving. You can calculate the number of servings of carbohydrates in one serving by dividing the total carbohydrates by 15. For example, if a food has 30 g of total carbohydrates, it would be equal to 2 servings of carbohydrates.  Check the number of grams (g) of saturated and trans fats in one serving. Choose foods that have low or no amount of these fats.  Check the number of milligrams (mg) of sodium in one serving. Most people should limit total sodium intake to less than 2,300 mg per day.  Always check the nutrition information of foods labeled as "low-fat" or "nonfat". These foods may be higher in added sugar or refined carbohydrates and should be avoided.  Talk to your dietitian to identify your daily goals for nutrients listed on the label. Shopping  Avoid buying canned, premade, or processed foods. These foods tend to be high in fat, sodium, and added sugar.  Shop around the outside edge of the grocery store. This includes fresh fruits and vegetables, bulk grains, fresh meats, and fresh dairy. Cooking  Use low-heat cooking methods, such as baking, instead of high-heat cooking methods like deep frying.  Cook using healthy oils, such as olive, canola, or sunflower oil.  Avoid cooking with butter, cream, or high-fat meats. Meal planning  Eat meals and snacks regularly, preferably at the same times every day. Avoid going long periods of time without eating.  Eat foods high in fiber, such as fresh fruits, vegetables, beans, and whole grains. Talk to your dietitian about how many servings of carbohydrates you can eat at each meal.  Eat 4-6 ounces of lean protein each day, such as lean meat, chicken, fish, eggs, or tofu. 1 ounce is equal to 1 ounce of meat, chicken, or fish, 1 egg, or 1/4 cup of tofu.  Eat some foods each day that contain healthy fats, such  as avocado, nuts, seeds, and fish. Lifestyle   Check your blood glucose regularly.  Exercise at least 30 minutes 5 or more days each week, or as told by your health care provider.  Take medicines as told by your health care provider.  Do not use any products that contain nicotine or tobacco, such as cigarettes and e-cigarettes. If you need help quitting, ask your health care provider.  Work with a Social worker or diabetes educator to identify strategies to manage stress and any emotional and social challenges. What are some questions to ask my health care provider?  Do I need to meet with a diabetes educator?  Do I need to meet with a dietitian?  What number can I call if I have questions?  When are the best times to check my blood glucose? Where to find more information:  American Diabetes Association: diabetes.org/food-and-fitness/food  Academy of Nutrition and Dietetics: PokerClues.dk  [employer] of Diabetes and Digestive and Kidney Diseases (NIH): ContactWire.be Summary  A healthy meal plan will help you control your blood glucose and maintain a healthy lifestyle.  Working with a diet and nutrition specialist (dietitian) can help you make a meal  plan that is best for you.  Keep in mind that carbohydrates and alcohol have immediate effects on your blood glucose levels. It is important to count carbohydrates and to use alcohol carefully. This information is not intended to replace advice given to you by your health care provider. Make sure you discuss any questions you have with your health care provider. Document Released: 06/17/2005 Document Revised: 10/25/2016 Document Reviewed: 10/25/2016 Elsevier Interactive Patient Education  2018 [employer].    Hypertension Hypertension, commonly called high blood pressure, is when the force of blood  pumping through the arteries is too strong. The arteries are the blood vessels that carry blood from the heart throughout the body. Hypertension forces the heart to work harder to pump blood and may cause arteries to become narrow or stiff. Having untreated or uncontrolled hypertension can cause heart attacks, strokes, kidney disease, and other problems. A blood pressure reading consists of a higher number over a lower number. Ideally, your blood pressure should be below 120/80. The first ("top") number is called the systolic pressure. It is a measure of the pressure in your arteries as your heart beats. The second ("bottom") number is called the diastolic pressure. It is a measure of the pressure in your arteries as the heart relaxes. What are the causes? The cause of this condition is not known. What increases the risk? Some risk factors for high blood pressure are under your control. Others are not. Factors you can change  Smoking.  Having type 2 diabetes mellitus, high cholesterol, or both.  Not getting enough exercise or physical activity.  Being overweight.  Having too much fat, sugar, calories, or salt (sodium) in your diet.  Drinking too much alcohol. Factors that are difficult or impossible to change  Having chronic kidney disease.  Having a family history of high blood pressure.  Age. Risk increases with age.  Race. You may be at higher risk if you are African-American.  Gender. Men are at higher risk than women before age 62. After age 84, women are at higher risk than men.  Having obstructive sleep apnea.  Stress. What are the signs or symptoms? Extremely high blood pressure (hypertensive crisis) may cause:  Headache.  Anxiety.  Shortness of breath.  Nosebleed.  Nausea and vomiting.  Severe chest pain.  Jerky movements you cannot control (seizures).  How is this diagnosed? This condition is diagnosed by measuring your blood pressure while you are  seated, with your arm resting on a surface. The cuff of the blood pressure monitor will be placed directly against the skin of your upper arm at the level of your heart. It should be measured at least twice using the same arm. Certain conditions can cause a difference in blood pressure between your right and left arms. Certain factors can cause blood pressure readings to be lower or higher than normal (elevated) for a short period of time:  When your blood pressure is higher when you are in a health care provider's office than when you are at home, this is called white coat hypertension. Most people with this condition do not need medicines.  When your blood pressure is higher at home than when you are in a health care provider's office, this is called masked hypertension. Most people with this condition may need medicines to control blood pressure.  If you have a high blood pressure reading during one visit or you have normal blood pressure with other risk factors:  You may be asked to  return on a different day to have your blood pressure checked again.  You may be asked to monitor your blood pressure at home for 1 week or longer.  If you are diagnosed with hypertension, you may have other blood or imaging tests to help your health care provider understand your overall risk for other conditions. How is this treated? This condition is treated by making healthy lifestyle changes, such as eating healthy foods, exercising more, and reducing your alcohol intake. Your health care provider may prescribe medicine if lifestyle changes are not enough to get your blood pressure under control, and if:  Your systolic blood pressure is above 130.  Your diastolic blood pressure is above 80.  Your personal target blood pressure may vary depending on your medical conditions, your age, and other factors. Follow these instructions at home: Eating and drinking  Eat a diet that is high in fiber and potassium,  and low in sodium, added sugar, and fat. An example eating plan is called the DASH (Dietary Approaches to Stop Hypertension) diet. To eat this way: ? Eat plenty of fresh fruits and vegetables. Try to fill half of your plate at each meal with fruits and vegetables. ? Eat whole grains, such as whole wheat pasta, brown rice, or whole grain bread. Fill about one quarter of your plate with whole grains. ? Eat or drink low-fat dairy products, such as skim milk or low-fat yogurt. ? Avoid fatty cuts of meat, processed or cured meats, and poultry with skin. Fill about one quarter of your plate with lean proteins, such as fish, chicken without skin, beans, eggs, and tofu. ? Avoid premade and processed foods. These tend to be higher in sodium, added sugar, and fat.  Reduce your daily sodium intake. Most people with hypertension should eat less than 1,500 mg of sodium a day.  Limit alcohol intake to no more than 1 drink a day for nonpregnant women and 2 drinks a day for men. One drink equals 12 oz of beer, 5 oz of wine, or 1 oz of hard liquor. Lifestyle  Work with your health care provider to maintain a healthy body weight or to lose weight. Ask what an ideal weight is for you.  Get at least 30 minutes of exercise that causes your heart to beat faster (aerobic exercise) most days of the week. Activities may include walking, swimming, or biking.  Include exercise to strengthen your muscles (resistance exercise), such as pilates or lifting weights, as part of your weekly exercise routine. Try to do these types of exercises for 30 minutes at least 3 days a week.  Do not use any products that contain nicotine or tobacco, such as cigarettes and e-cigarettes. If you need help quitting, ask your health care provider.  Monitor your blood pressure at home as told by your health care provider.  Keep all follow-up visits as told by your health care provider. This is important. Medicines  Take over-the-counter and  prescription medicines only as told by your health care provider. Follow directions carefully. Blood pressure medicines must be taken as prescribed.  Do not skip doses of blood pressure medicine. Doing this puts you at risk for problems and can make the medicine less effective.  Ask your health care provider about side effects or reactions to medicines that you should watch for. Contact a health care provider if:  You think you are having a reaction to a medicine you are taking.  You have headaches that keep coming back (  recurring).  You feel dizzy.  You have swelling in your ankles.  You have trouble with your vision. Get help right away if:  You develop a severe headache or confusion.  You have unusual weakness or numbness.  You feel faint.  You have severe pain in your chest or abdomen.  You vomit repeatedly.  You have trouble breathing. Summary  Hypertension is when the force of blood pumping through your arteries is too strong. If this condition is not controlled, it may put you at risk for serious complications.  Your personal target blood pressure may vary depending on your medical conditions, your age, and other factors. For most people, a normal blood pressure is less than 120/80.  Hypertension is treated with lifestyle changes, medicines, or a combination of both. Lifestyle changes include weight loss, eating a healthy, low-sodium diet, exercising more, and limiting alcohol. This information is not intended to replace advice given to you by your health care provider. Make sure you discuss any questions you have with your health care provider. Document Released: 09/20/2005 Document Revised: 08/18/2016 Document Reviewed: 08/18/2016 Elsevier Interactive Patient Education  2018 [employer].     IF you received an x-ray today, you will receive an invoice from [facility]. Please contact [facility] at [phone] with questions or concerns  regarding your invoice.   IF you received labwork today, you will receive an invoice from [place]. Please contact [facility] at [phone] with questions or concerns regarding your invoice.   Our billing staff will not be able to assist you with questions regarding bills from these companies.  You will be contacted with the lab results as soon as they are available. The fastest way to get your results is to activate your My Chart account. Instructions are located on the last page of this paperwork. If you have not heard from [place] regarding the results in 2 weeks, please contact this office.

## 2018-03-03 LAB — BASIC METABOLIC PANEL
BUN/Creatinine Ratio: 32 — ABNORMAL HIGH (ref 12–28)
BUN: 35 mg/dL — AB (ref 8–27)
CALCIUM: 9.5 mg/dL (ref 8.7–10.3)
CHLORIDE: 105 mmol/L (ref 96–106)
CO2: 21 mmol/L (ref 20–29)
Creatinine, Ser: 1.09 mg/dL — ABNORMAL HIGH (ref 0.57–1.00)
GFR calc non Af Amer: 50 mL/min/{1.73_m2} — ABNORMAL LOW (ref >59)
GFR, EST AFRICAN AMERICAN: 58 mL/min/{1.73_m2} — AB (ref >59)
GLUCOSE: 114 mg/dL — AB (ref 65–99)
Potassium: 4.4 mmol/L (ref 3.5–5.2)
Sodium: 141 mmol/L (ref 134–144)

## 2018-03-03 LAB — URIC ACID: URIC ACID: 7.1 mg/dL (ref 2.5–7.1)

## 2018-03-08 ENCOUNTER — Ambulatory Visit (INDEPENDENT_AMBULATORY_CARE_PROVIDER_SITE_OTHER): Payer: PPO | Admitting: Urgent Care

## 2018-03-08 ENCOUNTER — Encounter: Payer: Self-pay | Admitting: Urgent Care

## 2018-03-08 VITALS — BP 127/76 | HR 102 | Temp 98.5°F | Resp 17 | Ht 62.5 in | Wt 202.0 lb

## 2018-03-08 DIAGNOSIS — E119 Type 2 diabetes mellitus without complications: Secondary | ICD-10-CM | POA: Diagnosis not present

## 2018-03-08 DIAGNOSIS — R609 Edema, unspecified: Secondary | ICD-10-CM

## 2018-03-08 DIAGNOSIS — I10 Essential (primary) hypertension: Secondary | ICD-10-CM | POA: Diagnosis not present

## 2018-03-08 MED ORDER — BLOOD GLUCOSE MONITOR KIT: PACK | 0 refills | Status: DC

## 2018-03-08 MED ORDER — BLOOD GLUCOSE MONITOR KIT
PACK | 0 refills | Status: DC
Start: 2018-03-08 — End: 2018-03-08

## 2018-03-08 MED ORDER — LISINOPRIL 10 MG PO TABS: 10.0000 mg | ORAL_TABLET | Freq: Every day | ORAL | 3 refills | Status: DC

## 2018-03-08 MED ORDER — GLIMEPIRIDE 1 MG PO TABS: 1.0000 mg | ORAL_TABLET | Freq: Every day | ORAL | 0 refills | Status: DC

## 2018-03-08 MED ORDER — BLOOD GLUCOSE MONITOR KIT: PACK | 0 refills | Status: AC

## 2018-03-08 NOTE — Progress Notes (Signed)
    MRN: [id number] DOB: [birth year]  Subjective:   Erica Thomas is a 72 y.o. female presenting for follow up on peripheral edema.  Patient has maintained her medications including furosemide, lisinopril and amlodipine.  As reported previously her cardiologist had cleared her.  It was at her last office visit that she went from 5 mg of amlodipine to 10 with her cardiologist.  Today, patient would like to consider coming off the amlodipine altogether.  She also reports that she is having a hard time with glipizide including shakiness and diarrhea.  Does not have the capacity to check your blood sugar at home because of not having a meter.  She is requesting a prescription for this today.  Denies chest pain, dyspnea, shortness of breath, heart racing, palpitations.  She is upset that she is also continuing to gain weight.  Erica Thomas has a current medication list which includes the following prescription(s): allopurinol, amlodipine, atorvastatin, furosemide, glipizide, lisinopril, and metoprolol succinate. Also is allergic to penicillins.  Erica Thomas  has a past medical history of Arthritis, Blood transfusion without reported diagnosis, Cataract, Diabetes mellitus without complication ([place]), Hyperlipidemia, and Hypertension. Also  has a past surgical history that includes Appendectomy; Abdominal hysterectomy (1974); Colonoscopy; Tonsillectomy; and Spinal cord decompression (03/28/2013).  Objective:   Vitals: BP 127/76   Pulse (!) 102   Temp 98.5 F (36.9 C) (Oral)   Resp 17   Ht 5' 2.5" (1.588 m)   Wt 202 lb (91.6 kg)   SpO2 98%   BMI 36.36 kg/m   Wt Readings from Last 3 Encounters:  03/08/18 202 lb (91.6 kg)  03/02/18 200 lb 6.4 oz (90.9 kg)  01/27/18 199 lb 3.2 oz (90.4 kg)    Physical Exam  Constitutional: She is oriented to person, place, and time. She appears well-developed and well-nourished.  HENT:  Mouth/Throat: Oropharynx is clear and moist.  Eyes: No scleral icterus.  Cardiovascular:  Normal rate, regular rhythm and intact distal pulses. Exam reveals no gallop and no friction rub.  No murmur heard. Pulmonary/Chest: No respiratory distress. She has no wheezes. She has no rales.  Neurological: She is alert and oriented to person, place, and time.  Skin: Skin is warm and dry.  Psychiatric: She has a normal mood and affect.   Assessment and Plan :   Essential hypertension  Peripheral edema - Plan: Brain natriuretic peptide, Basic metabolic panel  Type 2 diabetes mellitus without complication, without long-term current use of insulin (HCC)  Will stop amlodipine.  Monitoring parameters given for her blood pressure including titration instructions for her lisinopril.  She is to maintain Lasix.  We will stop glipizide and start glimepiride instead at a lower dose.  Recheck in 1 week.  [name], PA-C [facility] [phone] 03/08/2018 4:42 PM

## 2018-03-08 NOTE — Patient Instructions (Addendum)
Check your blood pressure at home. Since we are stopping amlodipine it may go up. However, you can still use a higher dose of lisinopril to stave this off. Check your blood pressure every other day. If it is persistently elevated, for instance greater than 641'R systolic (top number) for 3 consecutive readings, then increase your lisinopril dose by 10mg . And continue to do the same blood pressure checks.     IF you received an x-ray today, you will receive an invoice from [facility]. Please contact [facility] at [phone] with questions or concerns regarding your invoice.   IF you received labwork today, you will receive an invoice from [place]. Please contact [facility] at [phone] with questions or concerns regarding your invoice.   Our billing staff will not be able to assist you with questions regarding bills from these companies.  You will be contacted with the lab results as soon as they are available. The fastest way to get your results is to activate your My Chart account. Instructions are located on the last page of this paperwork. If you have not heard from [place] regarding the results in 2 weeks, please contact this office.

## 2018-03-08 NOTE — Addendum Note (Signed)
Addended by: Kittie Plater, Lashun Mccants HUA on: 03/08/2018 05:08 PM   Modules accepted: Orders

## 2018-03-09 ENCOUNTER — Encounter: Payer: Self-pay | Admitting: Urgent Care

## 2018-03-09 LAB — BASIC METABOLIC PANEL
BUN / CREAT RATIO: 33 — AB (ref 12–28)
BUN: 39 mg/dL — ABNORMAL HIGH (ref 8–27)
CHLORIDE: 103 mmol/L (ref 96–106)
CO2: 21 mmol/L (ref 20–29)
Calcium: 9.1 mg/dL (ref 8.7–10.3)
Creatinine, Ser: 1.2 mg/dL — ABNORMAL HIGH (ref 0.57–1.00)
GFR calc Af Amer: 52 mL/min/{1.73_m2} — ABNORMAL LOW (ref >59)
GFR calc non Af Amer: 45 mL/min/{1.73_m2} — ABNORMAL LOW (ref >59)
Glucose: 94 mg/dL (ref 65–99)
POTASSIUM: 4.6 mmol/L (ref 3.5–5.2)
SODIUM: 138 mmol/L (ref 134–144)

## 2018-03-09 LAB — BRAIN NATRIURETIC PEPTIDE: BNP: 19.5 pg/mL (ref 0.0–100.0)

## 2018-03-17 ENCOUNTER — Ambulatory Visit (INDEPENDENT_AMBULATORY_CARE_PROVIDER_SITE_OTHER): Payer: PPO | Admitting: Urgent Care

## 2018-03-17 ENCOUNTER — Encounter: Payer: Self-pay | Admitting: Urgent Care

## 2018-03-17 VITALS — BP 147/77 | HR 87 | Temp 98.5°F | Resp 16 | Ht 62.0 in | Wt 200.0 lb

## 2018-03-17 DIAGNOSIS — M109 Gout, unspecified: Secondary | ICD-10-CM

## 2018-03-17 DIAGNOSIS — E119 Type 2 diabetes mellitus without complications: Secondary | ICD-10-CM

## 2018-03-17 DIAGNOSIS — R609 Edema, unspecified: Secondary | ICD-10-CM

## 2018-03-17 DIAGNOSIS — I10 Essential (primary) hypertension: Secondary | ICD-10-CM

## 2018-03-17 NOTE — Patient Instructions (Addendum)
Check your blood pressure at home. Since we are stopping amlodipine it may go up. However, you can still use a higher dose of lisinopril to stave this off. Check your blood pressure every other day. If it is persistently elevated, for instance greater than 161'W systolic (top number) for 3 consecutive readings, then increase your lisinopril dose by 10mg . And continue to do the same blood pressure checks.      IF you received an x-ray today, you will receive an invoice from [facility]. Please contact [facility] at [phone] with questions or concerns regarding your invoice.   IF you received labwork today, you will receive an invoice from [facility]. Please contact [facility] at [phone] with questions or concerns regarding your invoice.   Our billing staff will not be able to assist you with questions regarding bills from these companies.  You will be contacted with the lab results as soon as they are available. The fastest way to get your results is to activate your My Chart account. Instructions are located on the last page of this paperwork. If you have not heard from [place] regarding the results in 2 weeks, please contact this office.

## 2018-03-17 NOTE — Progress Notes (Signed)
    MRN: [id number] DOB: [birth year]  Subjective:   Erica Thomas is a 72 y.o. female presenting for follow up on persistent peripheral edema, hypertension.  At her last office visit on 03/08/2018, we stopped her amlodipine.  Today, she reports that she has done a lot better off amlodipine albeit still has some lower leg swelling.  However, she reports that she is able to move a lot better without pain and has been able to use the compression stockings a little bit more.  She admits that her swelling is much better when she wakes up but progresses throughout the day.  Denies chest pain, dyspnea, shortness of breath, heart racing, palpitations.  She is currently taking 40 mg of Lasix and lisinopril 20 mg.  She has been checking her blood sugar at home and has been in the low 100s with 2 readings in the 50s, reports that she had shakiness that was resolved quickly with taking a glucose tablet.  Regarding her blood pressure she is generally been in the low 638L systolic over 50 to 37D diastolic with one reading at 168/88.  The latter reading however was right after working out.  Denies smoking cigarettes or drinking alcohol.  She did not take her blood pressure medication today.  Erica Thomas has a current medication list which includes the following prescription(s): allopurinol, atorvastatin, blood glucose meter kit and supplies, furosemide, glimepiride, lisinopril, lisinopril, and metoprolol succinate. Also is allergic to penicillins.  Erica Thomas  has a past medical history of Arthritis, Blood transfusion without reported diagnosis, Cataract, Diabetes mellitus without complication ([place]), Hyperlipidemia, and Hypertension. Also  has a past surgical history that includes Appendectomy; Abdominal hysterectomy (1974); Colonoscopy; Tonsillectomy; and Spinal cord decompression (03/28/2013).  Objective:   Vitals: BP (!) 147/77   Pulse 87   Temp 98.5 F (36.9 C) (Oral)   Resp 16   Ht '5\' 2"'$  (1.575 m)   Wt 200 lb (90.7 kg)    SpO2 96%   BMI 36.58 kg/m   BP Readings from Last 3 Encounters:  03/17/18 (!) 147/77  03/08/18 127/76  03/02/18 118/60    Physical Exam  Constitutional: She is oriented to person, place, and time. She appears well-developed and well-nourished.  Cardiovascular: Normal rate.  Pulmonary/Chest: Effort normal.  Musculoskeletal: She exhibits edema (1+ over mid lower legs bilaterally).  Neurological: She is alert and oriented to person, place, and time.   Assessment and Plan :   Peripheral edema  Essential hypertension  Type 2 diabetes mellitus without complication, without long-term current use of insulin (HCC)  HTN, goal below 140/80  Gout, unspecified cause, unspecified chronicity, unspecified site  Peripheral edema is significantly improved since stopping amlodipine.  We will maintain her Lasix and lisinopril at the current dosing.  Recommended patient use compression stockings more consistently.  She will let me know if her symptoms persist, we may need to pursue referral to vascular surgery or pursue a Doppler ultrasound to evaluate for venous reflux.  Patient verbalized understanding.  Otherwise we will follow-up in 6 months.  Keep follow-up appointment with nephrology in August 2019.  Erica Eagles, PA-C [facility] [phone] 03/17/2018  4:21 PM

## 2018-05-09 DIAGNOSIS — R600 Localized edema: Secondary | ICD-10-CM | POA: Diagnosis not present

## 2018-05-09 DIAGNOSIS — I129 Hypertensive chronic kidney disease with stage 1 through stage 4 chronic kidney disease, or unspecified chronic kidney disease: Secondary | ICD-10-CM | POA: Diagnosis not present

## 2018-05-09 DIAGNOSIS — M109 Gout, unspecified: Secondary | ICD-10-CM | POA: Diagnosis not present

## 2018-05-09 DIAGNOSIS — N183 Chronic kidney disease, stage 3 (moderate): Secondary | ICD-10-CM | POA: Diagnosis not present

## 2018-05-29 ENCOUNTER — Other Ambulatory Visit: Payer: Self-pay | Admitting: Urgent Care

## 2018-06-02 ENCOUNTER — Other Ambulatory Visit: Payer: Self-pay | Admitting: Urgent Care

## 2018-06-03 ENCOUNTER — Other Ambulatory Visit: Payer: Self-pay | Admitting: Urgent Care

## 2018-06-06 NOTE — Telephone Encounter (Signed)
One touch verio test strips refill Last Refill:0605/19 # 100  (in a kit) Last OV: 03/17/18 PCP: Erica Eagles, PA Pharmacy:[facility] # [phone]

## 2018-07-08 DIAGNOSIS — Z1231 Encounter for screening mammogram for malignant neoplasm of breast: Secondary | ICD-10-CM | POA: Diagnosis not present

## 2018-07-08 LAB — HM MAMMOGRAPHY

## 2018-07-13 DIAGNOSIS — N183 Chronic kidney disease, stage 3 (moderate): Secondary | ICD-10-CM | POA: Diagnosis not present

## 2018-07-17 DIAGNOSIS — N183 Chronic kidney disease, stage 3 (moderate): Secondary | ICD-10-CM | POA: Diagnosis not present

## 2018-07-17 DIAGNOSIS — R600 Localized edema: Secondary | ICD-10-CM | POA: Diagnosis not present

## 2018-07-17 DIAGNOSIS — I129 Hypertensive chronic kidney disease with stage 1 through stage 4 chronic kidney disease, or unspecified chronic kidney disease: Secondary | ICD-10-CM | POA: Diagnosis not present

## 2018-08-04 ENCOUNTER — Ambulatory Visit (INDEPENDENT_AMBULATORY_CARE_PROVIDER_SITE_OTHER): Payer: PPO | Admitting: Physician Assistant

## 2018-08-04 ENCOUNTER — Other Ambulatory Visit: Payer: Self-pay

## 2018-08-04 ENCOUNTER — Encounter: Payer: Self-pay | Admitting: Physician Assistant

## 2018-08-04 VITALS — BP 130/70 | HR 101 | Temp 99.3°F | Resp 16 | Ht 61.5 in | Wt 199.8 lb

## 2018-08-04 DIAGNOSIS — I10 Essential (primary) hypertension: Secondary | ICD-10-CM | POA: Diagnosis not present

## 2018-08-04 DIAGNOSIS — E785 Hyperlipidemia, unspecified: Secondary | ICD-10-CM | POA: Diagnosis not present

## 2018-08-04 DIAGNOSIS — E119 Type 2 diabetes mellitus without complications: Secondary | ICD-10-CM | POA: Diagnosis not present

## 2018-08-04 LAB — POCT GLYCOSYLATED HEMOGLOBIN (HGB A1C): HEMOGLOBIN A1C: 6.6 % — AB (ref 4.0–5.6)

## 2018-08-04 MED ORDER — GLIMEPIRIDE 1 MG PO TABS: 0.5000 mg | ORAL_TABLET | Freq: Every day | ORAL | 1 refills | Status: DC

## 2018-08-04 MED ORDER — METOPROLOL SUCCINATE 50 MG PO CS24: 1.0000 | EXTENDED_RELEASE_CAPSULE | Freq: Every day | ORAL | 1 refills | Status: DC

## 2018-08-04 MED ORDER — LISINOPRIL 10 MG PO TABS: 10.0000 mg | ORAL_TABLET | Freq: Every day | ORAL | 1 refills | Status: DC

## 2018-08-04 MED ORDER — LISINOPRIL 20 MG PO TABS: 20.0000 mg | ORAL_TABLET | Freq: Every day | ORAL | 1 refills | Status: DC

## 2018-08-04 MED ORDER — ATORVASTATIN CALCIUM 40 MG PO TABS: 40.0000 mg | ORAL_TABLET | Freq: Every day | ORAL | 3 refills | Status: DC

## 2018-08-04 NOTE — Progress Notes (Signed)
MRN: [id number]  Subjective:   Erica Thomas is a 72 y.o. female who presents for follow up on chronic medical conditions.   Type 2 diabetes mellitus: Patient is currently managed with continued amaryl 1mg  daily due to kidney issues. Has been on this for at least one year.  Admits good compliance. Denies adverse effects including metallic taste, hypoglycemia, nausea, vomiting. Patient is checking home blood sugars. Home blood sugar records: BGs range between 90 and 112. Will sometimes see 50-60 reading. Current symptoms include none. Patient denies nausea, paresthesia of the feet, polydipsia, polyuria, visual disturbances and vomiting. Patient is checking their feet daily. No foot concerns. Last diabetic eye exam eye exam: 2018. Known diabetic complications: none.Immunizations: Flu vaccine:2019, shingles: completed, pneumococal vaccine: completed series  HTN: Currently managed with lisinopril 20mg  daily, will sometimes take 30mg  daily, and metoprolol 50mg  daily. Patient is checking blood pressure at home, range is 381-017 systolic.  Denies lightheadedness, dizziness, chronic headache, double vision, chest pain, shortness of breath, heart racing, palpitations, nausea, vomiting, abdominal pain, hematuria, lower leg swelling.   HLD: takes lipitor 40mg  daily  CKD stage 3:  Just saw kidney doctor on 10/8 and had a BMP.  Creatinine 1.23.  GFR 43.  Was told it was really good and that she did not have to see him for 6 months. Takes lasix 40mg  daily.  Diet: egg, toast, bacon, orange, salad, chili, chicken/turkey burger.  Exercise: Tries to walk daily but walking less this past year.   Patient Active Problem List   Diagnosis Date Noted  . Abnormal mammogram of left breast 06/26/2014  . DDD (degenerative disc disease), lumbar 02/08/2013  . HTN, goal below 140/80 09/21/2012  . Limb pain 09/21/2012  . Hyperlipidemia 09/21/2012  . Diabetes ([place]) 09/21/2012   Social History   Socioeconomic  History  . Marital status: Divorced    Spouse name: Not on file  . Number of children: Not on file  . Years of education: Not on file  . Highest education level: Not on file  Occupational History  . Occupation: Retired    Fish farm manager: [employer]  Social Needs  . Financial resource strain: Not on file  . Food insecurity:    Worry: Not on file    Inability: Not on file  . Transportation needs:    Medical: Not on file    Non-medical: Not on file  Tobacco Use  . Smoking status: Never Smoker  . Smokeless tobacco: Never Used  Substance and Sexual Activity  . Alcohol use: No  . Drug use: No  . Sexual activity: Not on file  Lifestyle  . Physical activity:    Days per week: Not on file    Minutes per session: Not on file  . Stress: Not on file  Relationships  . Social connections:    Talks on phone: Not on file    Gets together: Not on file    Attends religious service: Not on file    Active member of club or organization: Not on file    Attends meetings of clubs or organizations: Not on file    Relationship status: Not on file  . Intimate partner violence:    Fear of current or ex partner: Not on file    Emotionally abused: Not on file    Physically abused: Not on file    Forced sexual activity: Not on file  Other Topics Concern  . Not on file  Social History Narrative   Divorced.  Education: [employer]. Exercise: No.      Objective:   PHYSICAL EXAM BP 130/70 (BP Location: Left Arm, Patient Position: Sitting, Cuff Size: Large)   Pulse (!) 101   Temp 99.3 F (37.4 C) (Oral)   Resp 16   Ht 5' 1.5" (1.562 m)   Wt 199 lb 12.8 oz (90.6 kg)   SpO2 100%   BMI 37.14 kg/m   Physical Exam  Constitutional: She is oriented to person, place, and time. She appears well-developed and well-nourished. No distress.  HENT:  Head: Normocephalic and atraumatic.  Mouth/Throat: Uvula is midline, oropharynx is clear and moist and mucous membranes are normal.  Eyes: Pupils are equal,  round, and reactive to light. Conjunctivae and EOM are normal.  Neck: Normal range of motion.  Cardiovascular: Normal rate, regular rhythm, normal heart sounds and intact distal pulses.  Pulmonary/Chest: Effort normal and breath sounds normal. She has no wheezes. She has no rhonchi. She has no rales.  Musculoskeletal:       Right lower leg: She exhibits edema (1+ edema to mid shin ).       Left lower leg: She exhibits edema (1+ edema to mid shin ).  Neurological: She is alert and oriented to person, place, and time.  Skin: Skin is warm and dry.  Psychiatric: She has a normal mood and affect.  Vitals reviewed.   Diabetic Foot Exam - Simple   Simple Foot Form Diabetic Foot exam was performed with the following findings:  Yes 08/04/2018  4:18 PM  Visual Inspection No deformities, no ulcerations, no other skin breakdown bilaterally:  Yes Sensation Testing Intact to touch and monofilament testing bilaterally:  Yes Pulse Check Posterior Tibialis and Dorsalis pulse intact bilaterally:  Yes Comments     Results for orders placed or performed in visit on 08/04/18 (from the past 24 hour(s))  POCT glycosylated hemoglobin (Hb A1C)     Status: Abnormal   Collection Time: 08/04/18  4:40 PM  Result Value Ref Range   Hemoglobin A1C 6.6 (A) 4.0 - 5.6 %   HbA1c POC (<> result, manual entry)     HbA1c, POC (prediabetic range)     HbA1c, POC (controlled diabetic range)       . Assessment and Plan :  1. Type 2 diabetes mellitus without complication, without long-term current use of insulin (HCC) Well-controlled.  A1C 6.6.  Rec decreasing to half a tablet daily to avoid hypoglycemic episodes.  Recommended checking fasting blood sugar daily.  If drops below 70, discontinue Amaryl.  Follow-up in 6 months. - POCT glycosylated hemoglobin (Hb A1C) - HM Diabetes Foot Exam - CBC with Differential/Platelet - Lipid panel - glimepiride (AMARYL) 1 MG tablet; Take 0.5 tablets (0.5 mg total) by mouth daily  with breakfast.  Dispense: 90 tablet; Refill: 1  2. Essential hypertension Controlled. Cont current med regimen. F/u in 6 months.  - lisinopril (PRINIVIL,ZESTRIL) 20 MG tablet; Take 1 tablet (20 mg total) by mouth daily.  Dispense: 90 tablet; Refill: 1 - lisinopril (PRINIVIL,ZESTRIL) 10 MG tablet; Take 1 tablet (10 mg total) by mouth daily.  Dispense: 90 tablet; Refill: 1 - Metoprolol Succinate 50 MG CS24; Take 1 tablet by mouth daily.  Dispense: 90 capsule; Refill: 1  3. Hyperlipidemia, unspecified hyperlipidemia type - atorvastatin (LIPITOR) 40 MG tablet; Take 1 tablet (40 mg total) by mouth daily.  Dispense: 90 tablet; Refill: [place], PA-C  [facility] 08/04/2018 4:43 PM

## 2018-08-04 NOTE — Patient Instructions (Addendum)
For diabetes: cut down to half a tablet of amaryl.  Your goal is 70-100. If you drop below 70 over the next two weeks, stop taking amaryl and contact our office. Signs of hypoglycemia are lightheadedness, blurred vision, headache, and confusion.    Follow up in 6 months.   Thank you for letting me participate in your health and well being.   If you have lab work done today you will be contacted with your lab results within the next 2 weeks.  If you have not heard from [place] then please contact us. The fastest way to get your results is to register for My Chart.   IF you received an x-ray today, you will receive an invoice from [facility]. Please contact [facility] at [phone] with questions or concerns regarding your invoice.   IF you received labwork today, you will receive an invoice from [facility]. Please contact [facility] at [phone] with questions or concerns regarding your invoice.   Our billing staff will not be able to assist you with questions regarding bills from these companies.  You will be contacted with the lab results as soon as they are available. The fastest way to get your results is to activate your My Chart account. Instructions are located on the last page of this paperwork. If you have not heard from [place] regarding the results in 2 weeks, please contact this office.

## 2018-08-05 LAB — LIPID PANEL
CHOL/HDL RATIO: 3.6 ratio (ref 0.0–4.4)
Cholesterol, Total: 154 mg/dL (ref 100–199)
HDL: 43 mg/dL (ref >39)
LDL CALC: 75 mg/dL (ref 0–99)
TRIGLYCERIDES: 181 mg/dL — AB (ref 0–149)
VLDL Cholesterol Cal: 36 mg/dL (ref 5–40)

## 2018-08-05 LAB — CBC WITH DIFFERENTIAL/PLATELET
BASOS: 0 %
Basophils Absolute: 0 10*3/uL (ref 0.0–0.2)
EOS (ABSOLUTE): 0.3 10*3/uL (ref 0.0–0.4)
EOS: 4 %
HEMATOCRIT: 22.5 % — AB (ref 34.0–46.6)
HEMOGLOBIN: 6.3 g/dL — AB (ref 11.1–15.9)
Immature Grans (Abs): 0 10*3/uL (ref 0.0–0.1)
Immature Granulocytes: 0 %
Lymphocytes Absolute: 1.4 10*3/uL (ref 0.7–3.1)
Lymphs: 21 %
MCH: 19 pg — ABNORMAL LOW (ref 26.6–33.0)
MCHC: 28 g/dL — AB (ref 31.5–35.7)
MCV: 68 fL — ABNORMAL LOW (ref 79–97)
MONOCYTES: 10 %
Monocytes Absolute: 0.7 10*3/uL (ref 0.1–0.9)
Neutrophils Absolute: 4.4 10*3/uL (ref 1.4–7.0)
Neutrophils: 65 %
Platelets: 425 10*3/uL (ref 150–450)
RBC: 3.32 x10E6/uL — AB (ref 3.77–5.28)
RDW: 20.4 % — ABNORMAL HIGH (ref 12.3–15.4)
WBC: 6.7 10*3/uL (ref 3.4–10.8)

## 2018-08-07 ENCOUNTER — Encounter: Payer: Self-pay | Admitting: Physician Assistant

## 2018-08-07 ENCOUNTER — Telehealth: Payer: Self-pay | Admitting: *Deleted

## 2018-08-07 ENCOUNTER — Encounter (HOSPITAL_COMMUNITY): Payer: Self-pay | Admitting: Emergency Medicine

## 2018-08-07 ENCOUNTER — Ambulatory Visit (INDEPENDENT_AMBULATORY_CARE_PROVIDER_SITE_OTHER): Payer: PPO | Admitting: Physician Assistant

## 2018-08-07 ENCOUNTER — Other Ambulatory Visit: Payer: Self-pay | Admitting: *Deleted

## 2018-08-07 ENCOUNTER — Inpatient Hospital Stay (HOSPITAL_COMMUNITY)
Admission: EM | Admit: 2018-08-07 | Discharge: 2018-08-09 | DRG: 378 | Disposition: A | Discharge status: Home / Self Care | Payer: PPO | Attending: Family Medicine | Admitting: Family Medicine

## 2018-08-07 ENCOUNTER — Other Ambulatory Visit: Payer: Self-pay

## 2018-08-07 VITALS — BP 120/54 | HR 76 | Temp 97.9°F | Resp 16

## 2018-08-07 DIAGNOSIS — Q2733 Arteriovenous malformation of digestive system vessel: Secondary | ICD-10-CM | POA: Diagnosis not present

## 2018-08-07 DIAGNOSIS — R0609 Other forms of dyspnea: Secondary | ICD-10-CM | POA: Diagnosis not present

## 2018-08-07 DIAGNOSIS — K5521 Angiodysplasia of colon with hemorrhage: Secondary | ICD-10-CM | POA: Diagnosis not present

## 2018-08-07 DIAGNOSIS — E119 Type 2 diabetes mellitus without complications: Secondary | ICD-10-CM

## 2018-08-07 DIAGNOSIS — I10 Essential (primary) hypertension: Secondary | ICD-10-CM

## 2018-08-07 DIAGNOSIS — E860 Dehydration: Secondary | ICD-10-CM | POA: Diagnosis present

## 2018-08-07 DIAGNOSIS — K5730 Diverticulosis of large intestine without perforation or abscess without bleeding: Secondary | ICD-10-CM | POA: Diagnosis present

## 2018-08-07 DIAGNOSIS — N179 Acute kidney failure, unspecified: Secondary | ICD-10-CM | POA: Diagnosis present

## 2018-08-07 DIAGNOSIS — M1990 Unspecified osteoarthritis, unspecified site: Secondary | ICD-10-CM | POA: Diagnosis not present

## 2018-08-07 DIAGNOSIS — Z800 Family history of malignant neoplasm of digestive organs: Secondary | ICD-10-CM | POA: Diagnosis not present

## 2018-08-07 DIAGNOSIS — K648 Other hemorrhoids: Secondary | ICD-10-CM | POA: Diagnosis not present

## 2018-08-07 DIAGNOSIS — K3189 Other diseases of stomach and duodenum: Secondary | ICD-10-CM | POA: Diagnosis not present

## 2018-08-07 DIAGNOSIS — Z794 Long term (current) use of insulin: Secondary | ICD-10-CM

## 2018-08-07 DIAGNOSIS — N189 Chronic kidney disease, unspecified: Secondary | ICD-10-CM | POA: Diagnosis not present

## 2018-08-07 DIAGNOSIS — R05 Cough: Secondary | ICD-10-CM | POA: Diagnosis not present

## 2018-08-07 DIAGNOSIS — R928 Other abnormal and inconclusive findings on diagnostic imaging of breast: Secondary | ICD-10-CM | POA: Diagnosis present

## 2018-08-07 DIAGNOSIS — D508 Other iron deficiency anemias: Secondary | ICD-10-CM | POA: Diagnosis not present

## 2018-08-07 DIAGNOSIS — M109 Gout, unspecified: Secondary | ICD-10-CM | POA: Diagnosis present

## 2018-08-07 DIAGNOSIS — M51369 Other intervertebral disc degeneration, lumbar region without mention of lumbar back pain or lower extremity pain: Secondary | ICD-10-CM | POA: Diagnosis present

## 2018-08-07 DIAGNOSIS — Z880 Allergy status to penicillin: Secondary | ICD-10-CM

## 2018-08-07 DIAGNOSIS — N183 Chronic kidney disease, stage 3 unspecified: Secondary | ICD-10-CM

## 2018-08-07 DIAGNOSIS — Z833 Family history of diabetes mellitus: Secondary | ICD-10-CM

## 2018-08-07 DIAGNOSIS — E0822 Diabetes mellitus due to underlying condition with diabetic chronic kidney disease: Secondary | ICD-10-CM

## 2018-08-07 DIAGNOSIS — D649 Anemia, unspecified: Secondary | ICD-10-CM

## 2018-08-07 DIAGNOSIS — D509 Iron deficiency anemia, unspecified: Secondary | ICD-10-CM | POA: Diagnosis not present

## 2018-08-07 DIAGNOSIS — E785 Hyperlipidemia, unspecified: Secondary | ICD-10-CM

## 2018-08-07 DIAGNOSIS — Z8041 Family history of malignant neoplasm of ovary: Secondary | ICD-10-CM

## 2018-08-07 DIAGNOSIS — Z79899 Other long term (current) drug therapy: Secondary | ICD-10-CM | POA: Diagnosis not present

## 2018-08-07 DIAGNOSIS — M5136 Other intervertebral disc degeneration, lumbar region: Secondary | ICD-10-CM | POA: Diagnosis present

## 2018-08-07 DIAGNOSIS — Z90710 Acquired absence of both cervix and uterus: Secondary | ICD-10-CM

## 2018-08-07 DIAGNOSIS — I129 Hypertensive chronic kidney disease with stage 1 through stage 4 chronic kidney disease, or unspecified chronic kidney disease: Secondary | ICD-10-CM | POA: Diagnosis not present

## 2018-08-07 DIAGNOSIS — E869 Volume depletion, unspecified: Secondary | ICD-10-CM | POA: Diagnosis present

## 2018-08-07 DIAGNOSIS — K5520 Angiodysplasia of colon without hemorrhage: Secondary | ICD-10-CM | POA: Diagnosis not present

## 2018-08-07 DIAGNOSIS — Z8249 Family history of ischemic heart disease and other diseases of the circulatory system: Secondary | ICD-10-CM

## 2018-08-07 DIAGNOSIS — K269 Duodenal ulcer, unspecified as acute or chronic, without hemorrhage or perforation: Secondary | ICD-10-CM | POA: Diagnosis present

## 2018-08-07 DIAGNOSIS — K449 Diaphragmatic hernia without obstruction or gangrene: Secondary | ICD-10-CM | POA: Diagnosis not present

## 2018-08-07 DIAGNOSIS — K317 Polyp of stomach and duodenum: Secondary | ICD-10-CM | POA: Diagnosis present

## 2018-08-07 DIAGNOSIS — I5030 Unspecified diastolic (congestive) heart failure: Secondary | ICD-10-CM | POA: Diagnosis not present

## 2018-08-07 DIAGNOSIS — E1122 Type 2 diabetes mellitus with diabetic chronic kidney disease: Secondary | ICD-10-CM | POA: Diagnosis not present

## 2018-08-07 DIAGNOSIS — R0602 Shortness of breath: Secondary | ICD-10-CM

## 2018-08-07 LAB — COMPREHENSIVE METABOLIC PANEL
ALBUMIN: 3.7 g/dL (ref 3.5–5.0)
ALT: 12 U/L (ref 0–44)
AST: 16 U/L (ref 15–41)
Alkaline Phosphatase: 108 U/L (ref 38–126)
Anion gap: 9 (ref 5–15)
BUN: 33 mg/dL — AB (ref 8–23)
CHLORIDE: 108 mmol/L (ref 98–111)
CO2: 23 mmol/L (ref 22–32)
Calcium: 9.2 mg/dL (ref 8.9–10.3)
Creatinine, Ser: 1.34 mg/dL — ABNORMAL HIGH (ref 0.44–1.00)
GFR calc Af Amer: 44 mL/min — ABNORMAL LOW (ref >60)
GFR, EST NON AFRICAN AMERICAN: 38 mL/min — AB (ref >60)
GLUCOSE: 88 mg/dL (ref 70–99)
POTASSIUM: 4.5 mmol/L (ref 3.5–5.1)
Sodium: 140 mmol/L (ref 135–145)
TOTAL PROTEIN: 6.7 g/dL (ref 6.5–8.1)
Total Bilirubin: 0.7 mg/dL (ref 0.3–1.2)

## 2018-08-07 LAB — CBC WITH DIFFERENTIAL/PLATELET
ABS IMMATURE GRANULOCYTES: 0.02 10*3/uL (ref 0.00–0.07)
BASOS ABS: 0 10*3/uL (ref 0.0–0.1)
BASOS PCT: 0 %
Eosinophils Absolute: 0.2 10*3/uL (ref 0.0–0.5)
Eosinophils Relative: 4 %
HEMATOCRIT: 23.2 % — AB (ref 36.0–46.0)
Hemoglobin: 6.2 g/dL — CL (ref 12.0–15.0)
IMMATURE GRANULOCYTES: 0 %
Lymphocytes Relative: 28 %
Lymphs Abs: 1.5 10*3/uL (ref 0.7–4.0)
MCH: 18.2 pg — ABNORMAL LOW (ref 26.0–34.0)
MCHC: 26.7 g/dL — AB (ref 30.0–36.0)
MCV: 68.2 fL — ABNORMAL LOW (ref 80.0–100.0)
MONO ABS: 0.7 10*3/uL (ref 0.1–1.0)
Monocytes Relative: 12 %
NEUTROS ABS: 3.1 10*3/uL (ref 1.7–7.7)
NEUTROS PCT: 56 %
PLATELETS: 414 10*3/uL — AB (ref 150–400)
RBC: 3.4 MIL/uL — AB (ref 3.87–5.11)
RDW: 21.8 % — AB (ref 11.5–15.5)
WBC: 5.6 10*3/uL (ref 4.0–10.5)
nRBC: 0.4 % — ABNORMAL HIGH (ref 0.0–0.2)

## 2018-08-07 LAB — POCT CBC
Granulocyte percent: 61.2 %G (ref 37–80)
HCT, POC: 20.9 % — AB (ref 29–41)
HEMOGLOBIN: 6.3 g/dL — AB (ref 9.5–13.5)
LYMPH, POC: 1.8 (ref 0.6–3.4)
MCH: 19.4 pg — AB (ref 27–31.2)
MCHC: 30.3 g/dL — AB (ref 31.8–35.4)
MCV: 64 fL — AB (ref 76–111)
MID (cbc): 0.4 (ref 0–0.9)
MPV: 7.2 fL (ref 0–99.8)
POC Granulocyte: 3.5 (ref 2–6.9)
POC LYMPH PERCENT: 32.3 %L (ref 10–50)
POC MID %: 6.5 %M (ref 0–12)
Platelet Count, POC: 445 10*3/uL — AB (ref 142–424)
RBC: 3.26 M/uL — AB (ref 4.04–5.48)
RDW, POC: 21.8 %
WBC: 5.7 10*3/uL (ref 4.6–10.2)

## 2018-08-07 LAB — IFOBT (OCCULT BLOOD): IMMUNOLOGICAL FECAL OCCULT BLOOD TEST: NEGATIVE

## 2018-08-07 LAB — PREPARE RBC (CROSSMATCH)

## 2018-08-07 MED ORDER — FUROSEMIDE 40 MG PO TABS: 40.0000 mg | ORAL_TABLET | Freq: Every day | ORAL | Status: DC

## 2018-08-07 MED ORDER — POLYETHYLENE GLYCOL 3350 17 G PO PACK: 17.0000 g | PACK | Freq: Every day | ORAL | Status: DC | PRN

## 2018-08-07 MED ORDER — ACETAMINOPHEN 325 MG PO TABS: 650.0000 mg | ORAL_TABLET | Freq: Four times a day (QID) | ORAL | Status: DC | PRN

## 2018-08-07 MED ORDER — LISINOPRIL 20 MG PO TABS
20.0000 mg | ORAL_TABLET | Freq: Every day | ORAL | Status: DC
  Administered 2018-08-08: 20 mg via ORAL
  Filled 2018-08-07: qty 1

## 2018-08-07 MED ORDER — ACETAMINOPHEN 650 MG RE SUPP: 650.0000 mg | Freq: Four times a day (QID) | RECTAL | Status: DC | PRN

## 2018-08-07 MED ORDER — SODIUM CHLORIDE 0.9% IV SOLUTION
Freq: Once | INTRAVENOUS | Status: AC
  Administered 2018-08-07: 20:00:00 via INTRAVENOUS

## 2018-08-07 MED ORDER — METOPROLOL SUCCINATE ER 50 MG PO TB24
50.0000 mg | ORAL_TABLET | Freq: Every day | ORAL | Status: DC
  Administered 2018-08-08 – 2018-08-09 (×2): 50 mg via ORAL
  Filled 2018-08-07 (×2): qty 1

## 2018-08-07 MED ORDER — INSULIN ASPART 100 UNIT/ML ~~LOC~~ SOLN: 0.0000 [IU] | Freq: Three times a day (TID) | SUBCUTANEOUS | Status: DC

## 2018-08-07 MED ORDER — ATORVASTATIN CALCIUM 40 MG PO TABS
40.0000 mg | ORAL_TABLET | Freq: Every day | ORAL | Status: DC
  Administered 2018-08-08: 40 mg via ORAL
  Filled 2018-08-07: qty 1

## 2018-08-07 NOTE — ED Triage Notes (Signed)
Pt to ER sent by PCP for hemoglobin 6.3. Pt states easily fatigued with walking long distance, aside from that she feels fine. Denies blood in stool.

## 2018-08-07 NOTE — ED Provider Notes (Signed)
Patient placed in Quick Look pathway, seen and evaluated   Chief Complaint: fatigue  HPI: Erica Thomas is a 72 y.o. female who presents to the ED from her PCP office for anemia. Patient reports her Hgb was 6.3 today. Patient reports being easily fatigued. Stool in office negative for blood  ROS: General: fatigue  Physical Exam:  BP (!) 154/93 (BP Location: Right Arm)   Pulse 72   Temp 99.4 F (37.4 C) (Oral)   Resp 18   SpO2 100%    Gen: No distress  Neuro: Awake and Alert  Skin: Warm and dry   Initiation of care has begun. The patient has been counseled on the process, plan, and necessity for staying for the completion/evaluation, and the remainder of the medical screening examination    Ashley Murrain, NP 08/07/18 [place], Kevin, MD 08/07/18 [phone]

## 2018-08-07 NOTE — Telephone Encounter (Signed)
Please advise patient she will need to come in for a point of care lab visit to recheck a blood test.  She will not need an appointment just say it is lab visit.  She will need to wait until the blood test is done so we can have results before she leaves.  It shouldn't take to long.

## 2018-08-07 NOTE — ED Provider Notes (Signed)
Erica Thomas   CSN: [id number] Arrival date & time: 08/07/18  1316     History   Chief Complaint Chief Complaint  Patient presents with  . Anemia    HPI Erica Thomas is a 72 y.o. female.  HPI Patient is a 72 year old female with a past medical history of diabetes, hyperlipidemia, hypertension, and CKD 3 who presents to the emergency department for evaluation of anemia.  Patient was seen by her primary care physician late last week at which time a CBC was obtained.  Patient was found to have a hemoglobin of 6.3 and as result was instructed to come back to the office for a repeat CBC.  Her hemoglobin was again found to be low and as result patient was sent to the emergency department for further evaluation.  Patient does endorse that she has been significantly more fatigued than her baseline for about a half.  States that she is having worsening dyspnea on exertion over the same time.  She denies any known sources of bleeding and they tested her stool earlier this afternoon which was negative for occult blood.  States that other than feeling fatigued and more short of breath than normal she has had no other symptoms.  Remaining review of systems is as below.  Past Medical History:  Diagnosis Date  . Arthritis   . Blood transfusion without reported diagnosis   . Cataract   . Diabetes mellitus without complication ([place])    control by diet only  . Hyperlipidemia   . Hypertension     Patient Active Problem List   Diagnosis Date Noted  . Abnormal mammogram of left breast 06/26/2014  . DDD (degenerative disc disease), lumbar 02/08/2013  . HTN, goal below 140/80 09/21/2012  . Limb pain 09/21/2012  . Hyperlipidemia 09/21/2012  . Diabetes ([place]) 09/21/2012    Past Surgical History:  Procedure Laterality Date  . ABDOMINAL HYSTERECTOMY  1974   Class V PAP  . APPENDECTOMY    . COLONOSCOPY    . SPINAL CORD DECOMPRESSION  03/28/2013     L5 S1   Dr Rolena Infante  . TONSILLECTOMY       OB History   None      Home Medications    Prior to Admission medications   Medication Sig Start Date End Date Taking? Authorizing Provider  allopurinol (ZYLOPRIM) 100 MG tablet Take 100 mg by mouth daily. 02/13/18   [provider]  atorvastatin (LIPITOR) 40 MG tablet Take 1 tablet (40 mg total) by mouth daily. 08/04/18   Tenna Delaine D, PA-C  blood glucose meter kit and supplies KIT Check fasting blood sugar once daily. Use diagnosis code of E11.9. 03/08/18   [name], PA-C  furosemide (LASIX) 40 MG tablet Take 40 mg by mouth daily. 02/08/18   [provider]  glimepiride (AMARYL) 1 MG tablet Take 0.5 tablets (0.5 mg total) by mouth daily with breakfast. 08/04/18   Tenna Delaine D, PA-C  lisinopril (PRINIVIL,ZESTRIL) 10 MG tablet Take 1 tablet (10 mg total) by mouth daily. 08/04/18   Tenna Delaine D, PA-C  lisinopril (PRINIVIL,ZESTRIL) 20 MG tablet Take 1 tablet (20 mg total) by mouth daily. 08/04/18   Tenna Delaine D, PA-C  Metoprolol Succinate 50 MG CS24 Take 1 tablet by mouth daily. 08/04/18   Tenna Delaine D, PA-C  ONETOUCH DELICA LANCETS FINE MISC USE AS DIRECTED 06/02/18   [name], PA-C  [facility] VERIO test strip USE ONCE DAILY *  E11.9* 06/06/18   [name], PA-C    Family History Family History  Problem Relation Age of Onset  . Stroke Mother   . Diabetes Mother   . Heart disease Mother   . Hyperlipidemia Mother   . Heart disease Father   . Cancer Sister        Ovarian and stomach cancer  . Diabetes Maternal Grandmother   . Heart disease Maternal Grandmother   . Cancer Maternal Grandfather   . Arthritis Paternal Grandmother     Social History Social History   Tobacco Use  . Smoking status: Never Smoker  . Smokeless tobacco: Never Used  Substance Use Topics  . Alcohol use: No  . Drug use: No     Allergies   Penicillins   Review of Systems Review of Systems  Constitutional:  Positive for fatigue. Negative for chills and fever.  HENT: Negative for ear pain and sore throat.   Eyes: Negative for pain and visual disturbance.  Respiratory: Positive for shortness of breath. Negative for cough.   Cardiovascular: Negative for chest pain and palpitations.  Gastrointestinal: Negative for abdominal pain, anal bleeding, blood in stool and vomiting.  Genitourinary: Negative for dysuria and hematuria.  Musculoskeletal: Negative for arthralgias and back pain.  Skin: Negative for color change and rash.  Neurological: Negative for seizures and syncope.  Psychiatric/Behavioral: Negative for agitation and confusion.  All other systems reviewed and are negative.    Physical Exam Updated Vital Signs BP (!) 133/97   Pulse 78   Temp 99.4 F (37.4 C) (Oral)   Resp 17   SpO2 96%   Physical Exam  Constitutional: She appears well-developed and well-nourished. No distress.  HENT:  Head: Normocephalic and atraumatic.  Eyes: Conjunctivae are normal.  Neck: Neck supple.  Cardiovascular: Normal rate and regular rhythm.  No murmur heard. Pulmonary/Chest: Effort normal and breath sounds normal. No respiratory distress.  Abdominal: Soft. There is no tenderness.  Musculoskeletal: She exhibits no edema.  Neurological: She is alert.  Skin: Skin is warm and dry. There is pallor.  Psychiatric: She has a normal mood and affect.  Nursing Thomas and vitals reviewed.    ED Treatments / Results  Labs (all labs ordered are listed, but only abnormal results are displayed) Labs Reviewed  COMPREHENSIVE METABOLIC PANEL - Abnormal; Notable for the following components:      Result Value   BUN 33 (*)    Creatinine, Ser 1.34 (*)    GFR calc non Af Amer 38 (*)    GFR calc Af Amer 44 (*)    All other components within normal limits  CBC WITH DIFFERENTIAL/PLATELET - Abnormal; Notable for the following components:   RBC 3.40 (*)    Hemoglobin 6.2 (*)    HCT 23.2 (*)    MCV 68.2 (*)     MCH 18.2 (*)    MCHC 26.7 (*)    RDW 21.8 (*)    Platelets 414 (*)    nRBC 0.4 (*)    All other components within normal limits    EKG None  Radiology No results found.  Procedures Procedures (including critical care time)  Medications Ordered in ED Medications - No data to display   Initial Impression / Assessment and Plan / ED Course  I have reviewed the triage vital signs and the nursing notes.  Pertinent labs & imaging results that were available during my care of the patient were reviewed by me and considered in my medical decision  making (see chart for details).     Patient is a 72 year old female with past medical history as detailed above who presents the emergency department for evaluation of fatigue and intermittent shortness of breath.  Patient was evaluated last week by her primary care physician who obtained a CBC which showed a significant anemia with a hemoglobin of 6.3.  As a result patient was called back to their office for reevaluation.  A repeat CBC was obtained which does show anemia with a hemoglobin of 6.3.  We repeated a CBC in the emergency department which again confirms patient is severely anemic with hemoglobin of 6.2.  Patient has no other complaints outside of fatigue and shortness of breath.  She denies any known source of bleeding and reports that a fecal occult blood test was performed earlier today that was negative and she requests that we not repeated at this time.  Given patient's symptomatic anemia a type and screen was performed and she was transfused 1 unit of packed red blood cells while in the emergency department.  The family medicine service was then consulted for admission and the patient will be admitted for further anemia work-up. For further information regarding patient's continued hospital course please see admitting team documentation.  The care of this patient was discussed with my attending physician Dr. Lita Mains, who voices  agreement with work-up and ED disposition.  Final Clinical Impressions(s) / ED Diagnoses   Final diagnoses:  None    ED Discharge Orders    None       Camillia Marcy, Chanda Busing, MD 08/08/18 1423    Julianne Rice, MD 08/09/18 1610

## 2018-08-07 NOTE — Progress Notes (Signed)
Erica Thomas  MRN: [id number] DOB: Feb 11, 1946  Subjective:  Erica Thomas is a 72 y.o. female with PMH hypertension, diabetes, hyperlipidemia, and CKD stage III, seen in office today for a chief complaint of f/u on low hemoglobin found incidentally on blood work on 11/1.  Hemoglobin 6.3.  Today, reports feeling overall fine.  She has had fatigue, shortness of breath, leg swelling for the past few months, no worsening of symptoms.  Will only feel lightheaded on occasion.  No dizziness today.  Denies chest pain, heart palpitations, black tarry stools, hematochezia, hematemesis, nausea, vomiting, weight loss, fever, loss of appetite, and abdominal pain.  Denies acute bleeding.  Not up-to-date on colonoscopy, last colonoscopy was 2008.  She has had mammogram, normal.  Status post hysterectomy, last Pap before 63 was normal.  Past medical history of blood transfusion years ago, cannot remember the date.  No past medical history of anemia that she is aware of.  Family history of ovarian and stomach cancer in sister.   Review of Systems  Per HPI  Patient Active Problem List   Diagnosis Date Noted  . Abnormal mammogram of left breast 06/26/2014  . DDD (degenerative disc disease), lumbar 02/08/2013  . HTN, goal below 140/80 09/21/2012  . Limb pain 09/21/2012  . Hyperlipidemia 09/21/2012  . Diabetes ([place]) 09/21/2012    Current Outpatient Medications on File Prior to Visit  Medication Sig Dispense Refill  . allopurinol (ZYLOPRIM) 100 MG tablet Take 100 mg by mouth daily.  6  . atorvastatin (LIPITOR) 40 MG tablet Take 1 tablet (40 mg total) by mouth daily. 90 tablet 3  . blood glucose meter kit and supplies KIT Check fasting blood sugar once daily. Use diagnosis code of E11.9. 1 each 0  . furosemide (LASIX) 40 MG tablet Take 40 mg by mouth daily.  5  . glimepiride (AMARYL) 1 MG tablet Take 0.5 tablets (0.5 mg total) by mouth daily with breakfast. 90 tablet 1  . lisinopril (PRINIVIL,ZESTRIL) 10 MG  tablet Take 1 tablet (10 mg total) by mouth daily. 90 tablet 1  . lisinopril (PRINIVIL,ZESTRIL) 20 MG tablet Take 1 tablet (20 mg total) by mouth daily. 90 tablet 1  . Metoprolol Succinate 50 MG CS24 Take 1 tablet by mouth daily. 90 capsule 1  . ONETOUCH DELICA LANCETS FINE MISC USE AS DIRECTED 100 each 0  . ONETOUCH VERIO test strip USE ONCE DAILY *E11.9* 100 each 0   No current facility-administered medications on file prior to visit.     Allergies  Allergen Reactions  . Penicillins Hives    LEGS      Social History   Socioeconomic History  . Marital status: Divorced    Spouse name: Not on file  . Number of children: Not on file  . Years of education: Not on file  . Highest education level: Not on file  Occupational History  . Occupation: Retired    Fish farm manager: [employer]  Social Needs  . Financial resource strain: Not on file  . Food insecurity:    Worry: Not on file    Inability: Not on file  . Transportation needs:    Medical: Not on file    Non-medical: Not on file  Tobacco Use  . Smoking status: Never Smoker  . Smokeless tobacco: Never Used  Substance and Sexual Activity  . Alcohol use: No  . Drug use: No  . Sexual activity: Not on file  Lifestyle  . Physical activity:  Days per week: Not on file    Minutes per session: Not on file  . Stress: Not on file  Relationships  . Social connections:    Talks on phone: Not on file    Gets together: Not on file    Attends religious service: Not on file    Active member of club or organization: Not on file    Attends meetings of clubs or organizations: Not on file    Relationship status: Not on file  . Intimate partner violence:    Fear of current or ex partner: Not on file    Emotionally abused: Not on file    Physically abused: Not on file    Forced sexual activity: Not on file  Other Topics Concern  . Not on file  Social History Narrative   Divorced. Education: [employer]. Exercise: No.    Objective:    BP (!) 120/54 (BP Location: Left Arm, Patient Position: Sitting, Cuff Size: Large)   Pulse 76   Temp 97.9 F (36.6 C) (Oral)   Resp 16   SpO2 99%   Physical Exam  Constitutional: She is oriented to person, place, and time. She appears well-developed and well-nourished. No distress.  HENT:  Head: Normocephalic and atraumatic.  Eyes: Conjunctivae are normal.  Neck: Normal range of motion.  Cardiovascular: Normal rate, regular rhythm, normal heart sounds and intact distal pulses.  Pulmonary/Chest: Effort normal and breath sounds normal. She has no wheezes. She has no rales.  Genitourinary: Rectal exam shows guaiac negative stool.  Genitourinary Comments: Chaperone present for GU exam.  Neurological: She is alert and oriented to person, place, and time.  Skin: Skin is warm and dry.  Psychiatric: She has a normal mood and affect.  Vitals reviewed.     Results for orders placed or performed in visit on 08/07/18 (from the past 24 hour(s))  POCT CBC     Status: Abnormal   Collection Time: 08/07/18 11:49 AM  Result Value Ref Range   WBC 5.7 4.6 - 10.2 K/uL   Lymph, poc 1.8 0.6 - 3.4   POC LYMPH PERCENT 32.3 10 - 50 %L   MID (cbc) 0.4 0 - 0.9   POC MID % 6.5 0 - 12 %M   POC Granulocyte 3.5 2 - 6.9   Granulocyte percent 61.2 37 - 80 %G   RBC 3.26 (A) 4.04 - 5.48 M/uL   Hemoglobin 6.3 (A) 9.5 - 13.5 g/dL   HCT, POC 20.9 (A) 29 - 41 %   MCV 64.0 (A) 76 - 111 fL   MCH, POC 19.4 (A) 27 - 31.2 pg   MCHC 30.3 (A) 31.8 - 35.4 g/dL   RDW, POC 21.8 %   Platelet Count, POC 445 (A) 142 - 424 K/uL   MPV 7.2 0 - 99.8 fL  IFOBT POC (occult bld, rslt in office)     Status: None   Collection Time: 08/07/18 12:27 PM  Result Value Ref Range   IFOBT Negative    Orthostatic VS for the past 24 hrs:  BP- Lying Pulse- Lying BP- Sitting Pulse- Sitting BP- Standing at 0 minutes Pulse- Standing at 0 minutes  08/07/18 1229 125/73 77 139/79 77 128/77 78      Assessment and Plan :  1. Low  hemoglobin This case was precepted with Dr. Mitchel Honour. Pt is overall well appearing, NAD. Vitals stable. Normal orthostatics; ifobt negative. Hemoglobin remains low at 6.3 (which is the same as it was 3 days ago).  Only CBC prior to that in Epic is from 5 years ago and hgb was 13.3. Only symptom present today is fatigue, but this has been stable for the past few months. Has had SOB and lower leg swelling over the past few months that she attributed to medication. No SOB today. No dizziness. No acute bleeding. Due to level, pt will likely warrant blood transfusion. Rec further evaluation and management by ED. Pt drove herself here and requests to drive herself to ED. Considering hemoglobin has been stable for the past 3 days, vitals stable in office, and she has not any worsening symptoms over the past few months, she can transport herself to ED via personal vehicle.  - POCT CBC - Care order/instruction - IFOBT POC (occult bld, rslt in office); Future - Iron, TIBC and Ferritin Panel - Reticulocytes - Lactate Dehydrogenase - Pathologist smear review - Orthostatic vital signs - IFOBT POC (occult bld, rslt in office)  2. Other iron deficiency anemia 3. Type 2 diabetes mellitus without complication, without long-term current use of insulin ([place]) 4. Essential hypertension 5. Hyperlipidemia, unspecified hyperlipidemia type 6. Chronic kidney disease, unspecified CKD stage     Tenna Delaine PA-C  [facility] 08/07/2018 12:33 PM

## 2018-08-07 NOTE — H&P (Addendum)
[facility] Admission History and Physical Service Pager: [phone]  Patient name: Erica Thomas Medical record number: [id number] Date of birth: 01-Jan-1946 Age: 72 y.o. Gender: female  Primary Care Provider: Patient, No Pcp Per Consultants: none Code Status: Full code  Chief Complaint: generalized fatigue DOE   Assessment and Plan: Erica Thomas is a 72 y.o. female presenting with generalized weakness with DOE found to have Hgb of 6.3. PMH is significant for HTN, IDDM, HLD, CKD III, DDD.   #Hemoglobin - anemia Vitals stable on arrival to ED except hypertensive to 154/93 which has improved to 138/65.  Patient is well appearing on exam and in NAD. Reports several month history of fatigue and some DOE.  FOBT done at PCP was negative and patient does not want to have another FOBT done in ED.  Receiving 1 unit pRBC in ED.  Outpatient labs were collected, Iron, TIBC, Ferritin, RETIC, LDH, PATH SMEAR and we will wait on these labs as patient is being transfused as repeating labs in the hospital would not change acute treatment. Patient's results are significant only for hgb of 13.3 in 03/2015 and without results in [place]. Therefore, unclear if this is an acute process vs chronic process. As patient has been experience symptoms over the last "few months" with no acute worsening, this suggests chronic process with decline in hemoglobin overtime. Additionally, patient denies symptoms of GI bleed or any acute bleeding. Patient's most recent colonoscopy 10 years ago, was normal. Patient denies clotting or bleeding disorder. Patient reports having transfusion years ago in the setting of surgery.  DDx: GIB vs ACD,  Admit to FMTS, Unit: tele, Attending: Neal  Continuous Cardiac monitoring & pulse ox Nurse walk with pulse ox  Vitals per unit   SCD's for DVT ppx   PT/OT not indicated for now.  F/U o/p labs  #DOE & generalized fatigue  Most recent cardiac study in  12/02/2017, myocardial perfusion imaging shows LVEF 69% with stress, rest, gated SPECT all wnl. "overall quality of study is excellent". Study is equivocal for ischemia. BNP on 03/08/2018 was 19.5. Thyroid testing in January of 2019 wnl. Last EKG in 2014.   Obtain BNP, EKG, Echo to rule out CHF or other cardiac abnormalities.   Supplemental O2 prn   #HTN Blood pressure has been wnl, ranging from 144 to 315 systolic and 40-08 diastolic. Most recent BP 126/60. O2 is 96-100% and RR and HR wnl.   Continue 40 lasix daily (before supper?) in AM,  lisinopril 66m daily and metoprolol 54mdaily. Holding home 1060misinopril PRN.   Ask patient about PM lasix dosing, possibly d/c on AM?    #DM 2 Most recent A1c is 6.6 on 08/04/2018. On home glimepiride 0.5mg53mily with breakfast.   Hold home glimepiride.  sSSI, no QHS correction   Monitor daily CBG  #HLD Most recent lipid panel on 08/04/18, TG 181, otherwise wnl.  At home on  Lipitor 40 mg daily.   Continue home med  #CKD III  Follows with [facility]. Creatinine 1.34 with GFR 38. Baseline Creatinine around 1.1. BUN Creatinine ration >30.   Avoid nephrotoxic agents.   Hold allopurinol.   Will keep antihypertensive agents as patient is anemic and do not want to increase risk for demand ischemia.   #Gout  At home, allopurinol 100mg90mly prescription by [facility]  Holding allopurinol while in patient   E: Replete PRN N: PO, NPO at midnight if anticipating intervention tomorrow.  GI ppx: mirilax PRN DVT ppx: SCD's in setting of anemia, GIB not ruled out.   Future Labs: AM BMP, CBC, EKG, Echo, BNP Disposition: Tele  History of the Present Illness  Erica Thomas is a 72 y.o. female who presents from her PCP's office for hemoglobin of 6.3 with negative FOBT. Patients symptoms are significant for DOE and generalized fatigue over the last "few months" with no acute worsening of symptoms.  She states she has felt tired and had  some feet swelling from a medication her cardiologist prescirbed her.  This medication was changed by her primary and since then her leg swelling has resolved. She reports that she has had some DOE. She describes when [place] the airport and was placed in a wheelchair due to her SOB. She reports that when she is walking casually, she does not have any problems.   Patient denies bloating, nausea, vomiting, heartburn, diarrhea, constipation, abdominal pain, hematemesis, hematochezia, BRBPR, dark stool, change in bowel habits. Her last colonoscopy was about 10 years ago and was normal. She has had one blood transfusion in the past in the setting of surgery.   In the ED, CMP and BMP were s/f BUN 33, K 4.5, Cr 1.34, GFR 38, Hgb 6.2, MCV 68.2, ovalocytes and polychromasia. Patient was type and screen and transfused 1u pRBC around 2000h. Post transfusion Hgb will be collected two hours after transfusion is completed.  Review Of Systems: Per HPI plus the following  Review of Systems  Constitutional: Positive for malaise/fatigue. Negative for chills, fever and weight loss.  HENT: Negative for hearing loss.   Eyes: Negative for blurred vision.  Cardiovascular: Negative for chest pain and palpitations.  Gastrointestinal: Negative for nausea and vomiting.  Neurological: Positive for dizziness. Negative for headaches.   Past Medical History: Past Medical History:  Diagnosis Date  . Arthritis   . Blood transfusion without reported diagnosis   . Cataract   . Diabetes mellitus without complication ([place])    control by diet only  . Hyperlipidemia   . Hypertension    Patient Active Problem List   Diagnosis Date Noted  . CKD (chronic kidney disease), stage III ([place]) 08/07/2018  . Symptomatic anemia 08/07/2018  . Abnormal mammogram of left breast 06/26/2014  . DDD (degenerative disc disease), lumbar 02/08/2013  . HTN, goal below 140/80 09/21/2012  . Limb pain 09/21/2012  .  Hyperlipidemia 09/21/2012  . Diabetes ([place]) 09/21/2012    Past Surgical History: Past Surgical History:  Procedure Laterality Date  . ABDOMINAL HYSTERECTOMY  1974   Class V PAP  . APPENDECTOMY    . COLONOSCOPY    . SPINAL CORD DECOMPRESSION  03/28/2013   L5 S1   Dr Rolena Infante  . TONSILLECTOMY      Social History: Social History   Tobacco Use  . Smoking status: Never Smoker  . Smokeless tobacco: Never Used  Substance Use Topics  . Alcohol use: No  . Drug use: No   Additional social history: lives at home alone, she is a never smoker, rare alcohol use (about once per year), no illicit drug use.  Please also refer to relevant sections of EMR.  Family History: Family History  Problem Relation Age of Onset  . Stroke Mother   . Diabetes Mother   . Heart disease Mother   . Hyperlipidemia Mother   . Heart disease Father   . Cancer Sister        Ovarian and stomach cancer  .  Diabetes Maternal Grandmother   . Heart disease Maternal Grandmother   . Cancer Maternal Grandfather   . Arthritis Paternal Grandmother    Allergies and Medications: Allergies  Allergen Reactions  . Penicillins Hives    Childhood reaction Has patient had a PCN reaction causing immediate rash, facial/tongue/throat swelling, SOB or lightheadedness with hypotension: Yes Has patient had a PCN reaction causing severe rash involving mucus membranes or skin necrosis: No Has patient had a PCN reaction that required hospitalization: No Has patient had a PCN reaction occurring within the last 10 years: No If all of the above answers are "NO", then may proceed with Cephalosporin use.   No current facility-administered medications on file prior to encounter.    Current Outpatient Medications on File Prior to Encounter  Medication Sig Dispense Refill  . acetaminophen (TYLENOL) 500 MG tablet Take 500 mg by mouth daily before breakfast.    . allopurinol (ZYLOPRIM) 100 MG tablet Take 100 mg by mouth daily before  supper.   6  . atorvastatin (LIPITOR) 40 MG tablet Take 1 tablet (40 mg total) by mouth daily. (Patient taking differently: Take 40 mg by mouth daily before supper. ) 90 tablet 3  . furosemide (LASIX) 40 MG tablet Take 40 mg by mouth daily before supper.   5  . glimepiride (AMARYL) 1 MG tablet Take 0.5 tablets (0.5 mg total) by mouth daily with breakfast. (Patient taking differently: Take 0.5 mg by mouth daily before breakfast. ) 90 tablet 1  . lisinopril (PRINIVIL,ZESTRIL) 10 MG tablet Take 1 tablet (10 mg total) by mouth daily. (Patient taking differently: Take 10 mg by mouth daily as needed (SBP >160). ) 90 tablet 1  . lisinopril (PRINIVIL,ZESTRIL) 20 MG tablet Take 1 tablet (20 mg total) by mouth daily. (Patient taking differently: Take 20 mg by mouth daily before supper. ) 90 tablet 1  . Metoprolol Succinate 50 MG CS24 Take 1 tablet by mouth daily. (Patient taking differently: Take 50 mg by mouth daily before breakfast. ) 90 capsule 1  . blood glucose meter kit and supplies KIT Check fasting blood sugar once daily. Use diagnosis code of E11.9. 1 each 0  . ONETOUCH DELICA LANCETS FINE MISC USE AS DIRECTED 100 each 0  . ONETOUCH VERIO test strip USE ONCE DAILY *E11.9* 100 each 0    Objective: BP 140/68   Pulse 79   Temp 98.3 F (36.8 C) (Oral)   Resp 20   SpO2 100%  Exam: Gen: NAD, alert, non-toxic, well-nourished, well-appearing HEENT: Normocephaic, atraumatic. PERRLA, clear conjuctiva, no scleral icterus and injection. Normal EOM.  Hearing intact. Nares patent with no discharge.  CV: Regular rate and rhythm.  Normal S1-S2.  No murmur, gallops, rubs appreciated.  Normal cap refill bilaterally.  RP 2+ bilaterally. No BLEE. Resp: CTAB.  No w/r/r/abnormal breath sounds.  No increased WOB appreciated. Abd: NTND on palpation to all 4 quadrants. No masses palpated. Positive bowel sounds. Back: No CVAT. Spine symmetric with no TTP on spinous processes.  Skin: No obvious rashes, lesions, or  trauma.  Normal turgor.  MSK: Normal ROM. Normal muscle strength and tone.  Neuro: Alert and oriented x4. Cranial nerves II through VI grossly intact. Gait not appreciated.    No obvious abnormal movements. Psych: Cooperative with exam.  Normal speech. Pleasant. Makes good eye contact. Genitourinary: deferred.   Labs and Imaging: CBC BMET  Recent Labs  Lab 08/07/18 1348  WBC 5.6  HGB 6.2*  HCT 23.2*  PLT 414*  Recent Labs  Lab 08/07/18 1348  NA 140  K 4.5  CL 108  CO2 23  BUN 33*  CREATININE 1.34*  GLUCOSE 88  CALCIUM 9.2     Imaging/Diagnostic Tests: No results found.  Wilber Oliphant, M.D. 08/07/2018, 9:43 PM PGY-1, [place] Intern pager: [phone], text pages welcome  I have seen and evaluated the above patient with Dr. Maudie Mercury and agree with her documentation.  I am including my edits in blue.  Lovenia Kim MD  [place] PGY3

## 2018-08-07 NOTE — Patient Instructions (Signed)
Please go to the emergency department for further evaluation of hemoglobin of 6.3.   Thank you for letting me participate in your health and well being.

## 2018-08-08 ENCOUNTER — Inpatient Hospital Stay (HOSPITAL_COMMUNITY): Payer: PPO

## 2018-08-08 ENCOUNTER — Other Ambulatory Visit: Payer: Self-pay

## 2018-08-08 DIAGNOSIS — R0609 Other forms of dyspnea: Secondary | ICD-10-CM

## 2018-08-08 DIAGNOSIS — R0602 Shortness of breath: Secondary | ICD-10-CM

## 2018-08-08 DIAGNOSIS — I5030 Unspecified diastolic (congestive) heart failure: Secondary | ICD-10-CM

## 2018-08-08 LAB — PREPARE RBC (CROSSMATCH)

## 2018-08-08 LAB — GLUCOSE, CAPILLARY
GLUCOSE-CAPILLARY: 211 mg/dL — AB (ref 70–99)
GLUCOSE-CAPILLARY: 82 mg/dL (ref 70–99)
GLUCOSE-CAPILLARY: 79 mg/dL (ref 70–99)
Glucose-Capillary: 77 mg/dL (ref 70–99)
Glucose-Capillary: 88 mg/dL (ref 70–99)

## 2018-08-08 LAB — BASIC METABOLIC PANEL
Anion gap: 7 (ref 5–15)
BUN: 30 mg/dL — ABNORMAL HIGH (ref 8–23)
CALCIUM: 8.7 mg/dL — AB (ref 8.9–10.3)
CO2: 23 mmol/L (ref 22–32)
CREATININE: 1.37 mg/dL — AB (ref 0.44–1.00)
Chloride: 109 mmol/L (ref 98–111)
GFR, EST AFRICAN AMERICAN: 43 mL/min — AB (ref >60)
GFR, EST NON AFRICAN AMERICAN: 37 mL/min — AB (ref >60)
Glucose, Bld: 213 mg/dL — ABNORMAL HIGH (ref 70–99)
Potassium: 4.2 mmol/L (ref 3.5–5.1)
Sodium: 139 mmol/L (ref 135–145)

## 2018-08-08 LAB — IRON,TIBC AND FERRITIN PANEL
Ferritin: 7 ng/mL — ABNORMAL LOW (ref 15–150)
IRON SATURATION: 4 % — AB (ref 15–55)
IRON: 17 ug/dL — AB (ref 27–139)
Total Iron Binding Capacity: 385 ug/dL (ref 250–450)
UIBC: 368 ug/dL (ref 118–369)

## 2018-08-08 LAB — APTT: APTT: 31 s (ref 24–36)

## 2018-08-08 LAB — HEMOGLOBIN AND HEMATOCRIT, BLOOD
HEMATOCRIT: 28 % — AB (ref 36.0–46.0)
Hemoglobin: 8.4 g/dL — ABNORMAL LOW (ref 12.0–15.0)

## 2018-08-08 LAB — PROTIME-INR
INR: 1.09
PROTHROMBIN TIME: 14 s (ref 11.4–15.2)

## 2018-08-08 LAB — BRAIN NATRIURETIC PEPTIDE: B Natriuretic Peptide: 103.5 pg/mL — ABNORMAL HIGH (ref 0.0–100.0)

## 2018-08-08 LAB — CBC
HEMATOCRIT: 22.9 % — AB (ref 36.0–46.0)
Hemoglobin: 6.6 g/dL — CL (ref 12.0–15.0)
MCH: 19.8 pg — ABNORMAL LOW (ref 26.0–34.0)
MCHC: 28.8 g/dL — AB (ref 30.0–36.0)
MCV: 68.6 fL — ABNORMAL LOW (ref 80.0–100.0)
NRBC: 0 % (ref 0.0–0.2)
PLATELETS: 325 10*3/uL (ref 150–400)
RBC: 3.34 MIL/uL — ABNORMAL LOW (ref 3.87–5.11)
RDW: 21.5 % — ABNORMAL HIGH (ref 11.5–15.5)
WBC: 4.8 10*3/uL (ref 4.0–10.5)

## 2018-08-08 LAB — ECHOCARDIOGRAM COMPLETE

## 2018-08-08 LAB — LACTATE DEHYDROGENASE: LDH: 183 IU/L (ref 119–226)

## 2018-08-08 LAB — RETICULOCYTES: Retic Ct Pct: 1.5 % (ref 0.6–2.6)

## 2018-08-08 MED ORDER — SODIUM CHLORIDE 0.9% IV SOLUTION: Freq: Once | INTRAVENOUS | Status: DC

## 2018-08-08 MED ORDER — PEG 3350-KCL-NA BICARB-NACL 420 G PO SOLR
4000.0000 mL | Freq: Once | ORAL | Status: AC
  Administered 2018-08-08: 4000 mL via ORAL
  Filled 2018-08-08: qty 4000

## 2018-08-08 NOTE — Anesthesia Preprocedure Evaluation (Signed)
Anesthesia Evaluation  Patient identified by MRN, date of birth, ID band Patient awake    Reviewed: Allergy & Precautions, H&P , NPO status , Patient's Chart, lab work & pertinent test results, reviewed documented beta blocker date and time   History of Anesthesia Complications Negative for: history of anesthetic complications  Airway Mallampati: II  TM Distance: >3 FB Neck ROM: Full    Dental  (+) Teeth Intact, Dental Advisory Given, Partial Lower   Pulmonary neg pulmonary ROS,    breath sounds clear to auscultation       Cardiovascular hypertension, Pt. on medications and Pt. on home beta blockers  Rhythm:Regular Rate:Normal  ECHO 19 Study Conclusions The cavity size was normal. Wall thickness was   normal. Systolic function was normal. The estimated ejection   fraction was in the range of 60% to 65%. Wall motion was normal;   there were no regional wall motion abnormalities. Features are   consistent with a pseudonormal left ventricular filling pattern,   with concomitant abnormal relaxation and increased filling   pressure (grade 2 diastolic dysfunction).   Neuro/Psych negative neurological ROS     GI/Hepatic Neg liver ROS,   Endo/Other  diabetes, Type 2Diet controlled DM  Renal/GU CRFRenal disease     Musculoskeletal  (+) Arthritis , Osteoarthritis,    Abdominal (+) + obese,   Peds  Hematology  (+) anemia ,   Anesthesia Other Findings   Reproductive/Obstetrics                             Anesthesia Physical  Anesthesia Plan  ASA: III  Anesthesia Plan: MAC   Post-op Pain Management:    Induction: Intravenous  PONV Risk Score and Plan: 2 and Treatment may vary due to age or medical condition  Airway Management Planned:   Additional Equipment:   Intra-op Plan:   Post-operative Plan:   Informed Consent: I have reviewed the patients History and Physical, chart, labs  and discussed the procedure including the risks, benefits and alternatives for the proposed anesthesia with the patient or authorized representative who has indicated his/her understanding and acceptance.   Dental advisory given  Plan Discussed with: CRNA, Surgeon and Anesthesiologist  Anesthesia Plan Comments:         Anesthesia Quick Evaluation

## 2018-08-08 NOTE — Progress Notes (Signed)
  Echocardiogram 2D Echocardiogram has been performed.  Erica Thomas 08/08/2018, 2:13 PM

## 2018-08-08 NOTE — Care Management Note (Addendum)
Case Management Note  Patient Details  Name: Erica Thomas MRN: [id number] Date of Birth: October 25, 1945  Subjective/Objective:   From home alone, presents with symptomatic anemia, (weakness and DOE).  Received 2 units PRBC.  GI consulted. PCP is [place] at [facility] at [place], hospital follow scheduled for 11/12 at 3 pm.                Action/Plan: NCM will follow for transition of care needs.   Expected Discharge Date:                  Expected Discharge Plan:     In-House Referral:     Discharge planning Services  CM Consult  Post Acute Care Choice:    Choice offered to:     DME Arranged:    DME Agency:     HH Arranged:    HH Agency:     Status of Service:  In process, will continue to follow  If discussed at Long Length of Stay Meetings, dates discussed:    Additional Comments:  Zenon Mayo, RN 08/08/2018, 9:50 AM

## 2018-08-08 NOTE — CV Procedure (Signed)
Attempted 2D Echo, doctors in room with patient, will try again at a later time.   08/08/18  Cardell Peach

## 2018-08-08 NOTE — Progress Notes (Signed)
CRITICAL VALUE ALERT  Critical Value:  hgb 6.6  Date & Time Notified:  08/08/2018 0415  Provider Notified: FMTS on call provider  Orders Received/Actions taken: Transfuse 1 UPRBCs. Clint Bolder, RN 08/08/18 4:58 AM

## 2018-08-08 NOTE — Progress Notes (Signed)
Family Medicine Teaching Service Daily Progress Note Intern Pager: [phone]  Patient name: Erica Thomas [place] Medical record number: [id number] Date of birth: 1946/06/23 Age: 72 y.o. Gender: female  Primary Care Provider: Patient, No Pcp Per Consultants: GI Code Status: full  Pt Overview and Major Events to Date:  11/4 admitted  Assessment and Plan:  Symptomatic Anemia- outpatient labs showed hgb 6.3, TIBC 385, ferritin 7, iron 17. Patient denies bleeding- vaginal or rectal. Patient received 2units pRBC since admission. H/H after first unit brought up to 6.6 from 6.3 and second H/H pending. Consulted GI. Patient had normal colonoscopy ten years ago. - f/u GI recs - continuous cardiac monitoring - CBC am - vitals per unit - SCDs  HTN- home meds: lasix 40mg  daily, lisinopril 20mg  daily, metoprolol 50mg  daily. BPs have been hypertensive 131/91, 143/65 - continue lasix, metoprolol  DM2- home meds glimepiride .5mg  daily. A1c 6.6 08/2018. Glucose this mornign was 77. No insulin dose recorded given last night - sSSI - monitor CBGs  HLD- home meds: lipitor 40mg  daily - continue lipitor  CKDIII- follows with [facility]. Cr 1.37 today. - avoid nephrotoxic agents  Gout- home meds: allopurinol 100mg  daily - hold allopurinol  FEN/GI: NPO at this time for possible GI procedure PPx: SCDs  Disposition: med-surg with GI consult  Subjective:  Overnight: no acute events  Today: patient appears well in bed alert and oritented. She was being seen by GI and xray while I was in room so will return for follow up later  Objective: Temp:  [97.4 F (36.3 C)-99.4 F (37.4 C)] 98.1 F (36.7 C) (11/05 0748) Pulse Rate:  [72-86] 77 (11/05 0748) Resp:  [16-23] 17 (11/05 0533) BP: (108-154)/(54-97) 131/91 (11/05 0748) SpO2:  [96 %-100 %] 100 % (11/05 0748) Physical Exam: General: sitting in bed comfortably, NAD Cardiovascular: RRR, no murmur appreciated Respiratory: no increased  WOB Abdomen: soft, obese Extremities: no lower extremity edema  Laboratory: Recent Labs  Lab 08/04/18 1710 08/07/18 1149 08/07/18 1348 08/08/18 0238  WBC 6.7 5.7 5.6 4.8  HGB 6.3* 6.3* 6.2* 6.6*  HCT 22.5* 20.9* 23.2* 22.9*  PLT 425  --  414* 325   Recent Labs  Lab 08/07/18 1348 08/08/18 0238  NA 140 139  K 4.5 4.2  CL 108 109  CO2 23 23  BUN 33* 30*  CREATININE 1.34* 1.37*  CALCIUM 9.2 8.7*  PROT 6.7  --   BILITOT 0.7  --   ALKPHOS 108  --   ALT 12  --   AST 16  --   GLUCOSE 88 213*    Imaging/Diagnostic Tests: Chest xray pending  Richarda Osmond, DO 08/08/2018, 7:58 AM PGY-1, [place] Intern pager: [phone], text pages welcome

## 2018-08-08 NOTE — H&P (View-Only) (Signed)
[facility] Consult  Referring Provider: Family medicine teaching service/ Dorcas Mcmurray, MD Primary Care Physician:  Patient, No Pcp Per Primary Gastroenterologist: unassigned  Reason for Consultation:  Symptomatic anemia  HPI: Erica Thomas is a 72 y.o. female was in his usual state of health until 2 months ago when she started experiencing progressively worsening shortness of breath and fatigue. She had blood work done at [facility] and was found to have a hemoglobin around 6. She was sent for admission and further workup subsequently.  Patient reports having a colonoscopy more than 10 years ago in [place], reportedly was normal.  There is no family history of colon cancer.  She reports regular bowel movements, denies bloody bowel movement or black stools. She denies unintentional weight loss, loss of appetite or abdominal pain. She denies difficulty swallowing or pain on swallowing but complains of 'sensation of globus' in her throat even without eating.  She denies acid reflux, heartburn, early satiety or bloating. No prior endoscopy. She denies use of NSAIDs or aspirin. She takes extra strength Tylenol for arthritis.  Her younger sister passed away from ovarian and stomach cancer at 8.   Past Medical History:  Diagnosis Date  . Arthritis   . Blood transfusion without reported diagnosis   . Cataract   . Diabetes mellitus without complication ([place])    control by diet only  . Hyperlipidemia   . Hypertension     Past Surgical History:  Procedure Laterality Date  . ABDOMINAL HYSTERECTOMY  1974   Class V PAP  . APPENDECTOMY    . COLONOSCOPY    . SPINAL CORD DECOMPRESSION  03/28/2013   L5 S1   Dr Rolena Infante  . TONSILLECTOMY      Prior to Admission medications   Medication Sig Start Date End Date Taking? Authorizing Provider  acetaminophen (TYLENOL) 500 MG tablet Take 500 mg by mouth daily before breakfast.   Yes [provider]  allopurinol (ZYLOPRIM)  100 MG tablet Take 100 mg by mouth daily before supper.  02/13/18  Yes [provider]  atorvastatin (LIPITOR) 40 MG tablet Take 1 tablet (40 mg total) by mouth daily. Patient taking differently: Take 40 mg by mouth daily before supper.  08/04/18  Yes Timmothy Euler, [place] D, PA-C  furosemide (LASIX) 40 MG tablet Take 40 mg by mouth daily before supper.  02/08/18  Yes [provider]  glimepiride (AMARYL) 1 MG tablet Take 0.5 tablets (0.5 mg total) by mouth daily with breakfast. Patient taking differently: Take 0.5 mg by mouth daily before breakfast.  08/04/18  Yes Timmothy Euler, [place] D, PA-C  lisinopril (PRINIVIL,ZESTRIL) 10 MG tablet Take 1 tablet (10 mg total) by mouth daily. Patient taking differently: Take 10 mg by mouth daily as needed (SBP >160).  08/04/18  Yes Timmothy Euler, [place] D, PA-C  lisinopril (PRINIVIL,ZESTRIL) 20 MG tablet Take 1 tablet (20 mg total) by mouth daily. Patient taking differently: Take 20 mg by mouth daily before supper.  08/04/18  Yes Tenna Delaine D, PA-C  Metoprolol Succinate 50 MG CS24 Take 1 tablet by mouth daily. Patient taking differently: Take 50 mg by mouth daily before breakfast.  08/04/18  Yes Timmothy Euler, [place] D, PA-C  blood glucose meter kit and supplies KIT Check fasting blood sugar once daily. Use diagnosis code of E11.9. 03/08/18   [name], PA-C  [facility] DELICA LANCETS FINE MISC USE AS DIRECTED 06/02/18   [name], PA-C  [facility] VERIO test strip USE ONCE DAILY *E11.9* 06/06/18   Bess Harvest,  Freida Busman, PA-C    Current Facility-Administered Medications  Medication Dose Route Frequency Provider Last Rate Last Dose  . 0.9 %  sodium chloride infusion (Manually program via Guardrails IV Fluids)   Intravenous Once Schorr, Rhetta Mura, NP      . acetaminophen (TYLENOL) tablet 650 mg  650 mg Oral Q6H PRN Wilber Oliphant, MD       Or  . acetaminophen (TYLENOL) suppository 650 mg  650 mg Rectal Q6H PRN Wilber Oliphant, MD      . atorvastatin (LIPITOR) tablet 40 mg   40 mg Oral QAC supper Wilber Oliphant, MD      . furosemide (LASIX) tablet 40 mg  40 mg Oral QAC supper Wilber Oliphant, MD      . insulin aspart (novoLOG) injection 0-9 Units  0-9 Units Subcutaneous TID WC Wilber Oliphant, MD      . lisinopril (PRINIVIL,ZESTRIL) tablet 20 mg  20 mg Oral QAC supper Wilber Oliphant, MD      . metoprolol succinate (TOPROL-XL) 24 hr tablet 50 mg  50 mg Oral QAC breakfast Wilber Oliphant, MD   50 mg at 08/08/18 8676  . polyethylene glycol (MIRALAX / GLYCOLAX) packet 17 g  17 g Oral Daily PRN Wilber Oliphant, MD      . polyethylene glycol-electrolytes (NuLYTELY/GoLYTELY) solution 4,000 mL  4,000 mL Oral Once Ronnette Juniper, MD        Allergies as of 08/07/2018 - Review Complete 08/07/2018  Allergen Reaction Noted  . Penicillins Hives 07/20/2012    Family History  Problem Relation Age of Onset  . Stroke Mother   . Diabetes Mother   . Heart disease Mother   . Hyperlipidemia Mother   . Heart disease Father   . Cancer Sister        Ovarian and stomach cancer  . Diabetes Maternal Grandmother   . Heart disease Maternal Grandmother   . Cancer Maternal Grandfather   . Arthritis Paternal Grandmother     Social History   Socioeconomic History  . Marital status: Divorced    Spouse name: Not on file  . Number of children: Not on file  . Years of education: Not on file  . Highest education level: Not on file  Occupational History  . Occupation: Retired    Fish farm manager: [employer]  Social Needs  . Financial resource strain: Not on file  . Food insecurity:    Worry: Not on file    Inability: Not on file  . Transportation needs:    Medical: Not on file    Non-medical: Not on file  Tobacco Use  . Smoking status: Never Smoker  . Smokeless tobacco: Never Used  Substance and Sexual Activity  . Alcohol use: No  . Drug use: No  . Sexual activity: Not on file  Lifestyle  . Physical activity:    Days per week: Not on file    Minutes per session: Not on file  .  Stress: Not on file  Relationships  . Social connections:    Talks on phone: Not on file    Gets together: Not on file    Attends religious service: Not on file    Active member of club or organization: Not on file    Attends meetings of clubs or organizations: Not on file    Relationship status: Not on file  . Intimate partner violence:    Fear of current or ex partner: Not on file  Emotionally abused: Not on file    Physically abused: Not on file    Forced sexual activity: Not on file  Other Topics Concern  . Not on file  Social History Narrative   Divorced. Education: [employer]. Exercise: No.    Review of Systems: Positive for: GI: Described in detail in HPI.    Gen: fatigue, weakness, malaise,Denies any fever, chills, rigors, night sweats, anorexia,  involuntary weight loss, and sleep disorder VP:XTGGYIR, Denies chest pain, angina, palpitations, syncope, orthopnea, PND, peripheral edema, and claudication. Resp: Denies cough, sputum, wheezing, coughing up blood. GU : Denies urinary burning, blood in urine, urinary frequency, urinary hesitancy, nocturnal urination, and urinary incontinence. MS:leg swelling(takes furosemide), denies muscle weakness, cramps, atrophy.  Derm: Denies rash, itching, oral ulcerations, hives, unhealing ulcers.  Psych: Denies depression, anxiety, memory loss, suicidal ideation, hallucinations,  and confusion. Heme: Denies bruising, bleeding, and enlarged lymph nodes. Neuro:  Denies any headaches, dizziness, paresthesias. Endo:  DM, Denies any problems with thyroid, adrenal function.  Physical Exam: Vital signs in last 24 hours: Temp:  [97.4 F (36.3 C)-99.4 F (37.4 C)] 98.1 F (36.7 C) (11/05 0748) Pulse Rate:  [72-86] 77 (11/05 0748) Resp:  [16-23] 17 (11/05 0533) BP: (108-154)/(54-97) 131/91 (11/05 0748) SpO2:  [96 %-100 %] 100 % (11/05 0748) Last BM Date: 08/07/18  General:   Alert,  Well-developed, overweight, pleasant and cooperative in  NAD Head:  Normocephalic and atraumatic. Eyes:  Sclera clear, no icterus.   Mild pallor Ears:  Normal auditory acuity. Nose:  No deformity, discharge,  or lesions. Mouth:  No deformity or lesions.  Oropharynx pink & moist. Neck:  Supple; no masses or thyromegaly. Lungs:  Clear throughout to auscultation.   No wheezes, crackles, or rhonchi. No acute distress. Heart:  Regular rate and rhythm; no murmurs, clicks, rubs,  or gallops. Extremities:  Without clubbing or edema. Neurologic:  Alert and  oriented x4;  grossly normal neurologically. Skin:  Intact without significant lesions or rashes. Psych:  Alert and cooperative. Normal mood and affect. Abdomen:  Soft, nontender and nondistended. No masses, hepatosplenomegaly or hernias noted. Normal bowel sounds, without guarding, and without rebound.         Lab Results: Recent Labs    08/07/18 1149 08/07/18 1348 08/08/18 0238  WBC 5.7 5.6 4.8  HGB 6.3* 6.2* 6.6*  HCT 20.9* 23.2* 22.9*  PLT  --  414* 325   BMET Recent Labs    08/07/18 1348 08/08/18 0238  NA 140 139  K 4.5 4.2  CL 108 109  CO2 23 23  GLUCOSE 88 213*  BUN 33* 30*  CREATININE 1.34* 1.37*  CALCIUM 9.2 8.7*   LFT Recent Labs    08/07/18 1348  PROT 6.7  ALBUMIN 3.7  AST 16  ALT 12  ALKPHOS 108  BILITOT 0.7   PT/INR Recent Labs    08/08/18 0238  LABPROT 14.0  INR 1.09    Studies/Results: Dg Chest Port 1 View  Result Date: 08/08/2018 CLINICAL DATA:  72 year old female with shortness of breath with activity. Cough in mornings. Initial encounter. EXAM: PORTABLE CHEST 1 VIEW COMPARISON:  10/03/2017 and 03/26/2013 chest x-ray. FINDINGS: Heart slightly enlarged. Chronic slightly elevated right hemidiaphragm. No infiltrate, congestive heart failure or pneumothorax. Minimal linear scarring adjacent left heart border. No plain film evidence of pulmonary malignancy. Calcified slightly tortuous aorta. Bilateral acromioclavicular joint degenerative changes.  IMPRESSION: 1. Heart slightly enlarged. 2. No acute pulmonary abnormality noted as discussed above. 3.  Aortic Atherosclerosis (ICD10-I70.0).  Electronically Signed   By: Genia Del M.D.   On: 08/08/2018 08:59    Impression: Severe iron deficiency anemia,hemoglobin 6.3 on presentation with an MCV of 68,iron saturation 4%, ferritin 7 and an elevated TIBC 385.FOBT was negative.Patient has received 2 units PRBC transfusion Slightly elevated BUN/creatinine ratio of 33/1.34, however patient has chronic kidney disease with a GFR of 38  Plan: Severe iron deficiency anemia without obvious melena or hematochezia. Recommend diagnostic EGD with small bowel biopsies and colonoscopy in a.m. Discussed about the risks and benefits of the procedure with the patient in details. She understands and verbalizes consent. She remains hemodynamically stable.   LOS: 1 day   Ronnette Juniper, M.D.  08/08/2018, 9:38 AM  Pager [phone] If no answer or after 5 PM call [phone]

## 2018-08-08 NOTE — Consult Note (Signed)
[facility] Consult  Referring Provider: Family medicine teaching service/ Dorcas Mcmurray, MD Primary Care Physician:  Patient, No Pcp Per Primary Gastroenterologist: unassigned  Reason for Consultation:  Symptomatic anemia  HPI: Erica Thomas is a 72 y.o. female was in his usual state of health until 2 months ago when she started experiencing progressively worsening shortness of breath and fatigue. She had blood work done at [facility] and was found to have a hemoglobin around 6. She was sent for admission and further workup subsequently.  Patient reports having a colonoscopy more than 10 years ago in [place], reportedly was normal.  There is no family history of colon cancer.  She reports regular bowel movements, denies bloody bowel movement or black stools. She denies unintentional weight loss, loss of appetite or abdominal pain. She denies difficulty swallowing or pain on swallowing but complains of 'sensation of globus' in her throat even without eating.  She denies acid reflux, heartburn, early satiety or bloating. No prior endoscopy. She denies use of NSAIDs or aspirin. She takes extra strength Tylenol for arthritis.  Her younger sister passed away from ovarian and stomach cancer at 8.   Past Medical History:  Diagnosis Date  . Arthritis   . Blood transfusion without reported diagnosis   . Cataract   . Diabetes mellitus without complication ([place])    control by diet only  . Hyperlipidemia   . Hypertension     Past Surgical History:  Procedure Laterality Date  . ABDOMINAL HYSTERECTOMY  1974   Class V PAP  . APPENDECTOMY    . COLONOSCOPY    . SPINAL CORD DECOMPRESSION  03/28/2013   L5 S1   Dr Rolena Infante  . TONSILLECTOMY      Prior to Admission medications   Medication Sig Start Date End Date Taking? Authorizing Provider  acetaminophen (TYLENOL) 500 MG tablet Take 500 mg by mouth daily before breakfast.   Yes [provider]  allopurinol (ZYLOPRIM)  100 MG tablet Take 100 mg by mouth daily before supper.  02/13/18  Yes [provider]  atorvastatin (LIPITOR) 40 MG tablet Take 1 tablet (40 mg total) by mouth daily. Patient taking differently: Take 40 mg by mouth daily before supper.  08/04/18  Yes Timmothy Euler, [place] D, PA-C  furosemide (LASIX) 40 MG tablet Take 40 mg by mouth daily before supper.  02/08/18  Yes [provider]  glimepiride (AMARYL) 1 MG tablet Take 0.5 tablets (0.5 mg total) by mouth daily with breakfast. Patient taking differently: Take 0.5 mg by mouth daily before breakfast.  08/04/18  Yes Timmothy Euler, [place] D, PA-C  lisinopril (PRINIVIL,ZESTRIL) 10 MG tablet Take 1 tablet (10 mg total) by mouth daily. Patient taking differently: Take 10 mg by mouth daily as needed (SBP >160).  08/04/18  Yes Timmothy Euler, [place] D, PA-C  lisinopril (PRINIVIL,ZESTRIL) 20 MG tablet Take 1 tablet (20 mg total) by mouth daily. Patient taking differently: Take 20 mg by mouth daily before supper.  08/04/18  Yes Tenna Delaine D, PA-C  Metoprolol Succinate 50 MG CS24 Take 1 tablet by mouth daily. Patient taking differently: Take 50 mg by mouth daily before breakfast.  08/04/18  Yes Timmothy Euler, [place] D, PA-C  blood glucose meter kit and supplies KIT Check fasting blood sugar once daily. Use diagnosis code of E11.9. 03/08/18   [name], PA-C  [facility] DELICA LANCETS FINE MISC USE AS DIRECTED 06/02/18   [name], PA-C  [facility] VERIO test strip USE ONCE DAILY *E11.9* 06/06/18   Bess Harvest,  Freida Busman, PA-C    Current Facility-Administered Medications  Medication Dose Route Frequency Provider Last Rate Last Dose  . 0.9 %  sodium chloride infusion (Manually program via Guardrails IV Fluids)   Intravenous Once Schorr, Rhetta Mura, NP      . acetaminophen (TYLENOL) tablet 650 mg  650 mg Oral Q6H PRN Wilber Oliphant, MD       Or  . acetaminophen (TYLENOL) suppository 650 mg  650 mg Rectal Q6H PRN Wilber Oliphant, MD      . atorvastatin (LIPITOR) tablet 40 mg   40 mg Oral QAC supper Wilber Oliphant, MD      . furosemide (LASIX) tablet 40 mg  40 mg Oral QAC supper Wilber Oliphant, MD      . insulin aspart (novoLOG) injection 0-9 Units  0-9 Units Subcutaneous TID WC Wilber Oliphant, MD      . lisinopril (PRINIVIL,ZESTRIL) tablet 20 mg  20 mg Oral QAC supper Wilber Oliphant, MD      . metoprolol succinate (TOPROL-XL) 24 hr tablet 50 mg  50 mg Oral QAC breakfast Wilber Oliphant, MD   50 mg at 08/08/18 8676  . polyethylene glycol (MIRALAX / GLYCOLAX) packet 17 g  17 g Oral Daily PRN Wilber Oliphant, MD      . polyethylene glycol-electrolytes (NuLYTELY/GoLYTELY) solution 4,000 mL  4,000 mL Oral Once Ronnette Juniper, MD        Allergies as of 08/07/2018 - Review Complete 08/07/2018  Allergen Reaction Noted  . Penicillins Hives 07/20/2012    Family History  Problem Relation Age of Onset  . Stroke Mother   . Diabetes Mother   . Heart disease Mother   . Hyperlipidemia Mother   . Heart disease Father   . Cancer Sister        Ovarian and stomach cancer  . Diabetes Maternal Grandmother   . Heart disease Maternal Grandmother   . Cancer Maternal Grandfather   . Arthritis Paternal Grandmother     Social History   Socioeconomic History  . Marital status: Divorced    Spouse name: Not on file  . Number of children: Not on file  . Years of education: Not on file  . Highest education level: Not on file  Occupational History  . Occupation: Retired    Fish farm manager: [employer]  Social Needs  . Financial resource strain: Not on file  . Food insecurity:    Worry: Not on file    Inability: Not on file  . Transportation needs:    Medical: Not on file    Non-medical: Not on file  Tobacco Use  . Smoking status: Never Smoker  . Smokeless tobacco: Never Used  Substance and Sexual Activity  . Alcohol use: No  . Drug use: No  . Sexual activity: Not on file  Lifestyle  . Physical activity:    Days per week: Not on file    Minutes per session: Not on file  .  Stress: Not on file  Relationships  . Social connections:    Talks on phone: Not on file    Gets together: Not on file    Attends religious service: Not on file    Active member of club or organization: Not on file    Attends meetings of clubs or organizations: Not on file    Relationship status: Not on file  . Intimate partner violence:    Fear of current or ex partner: Not on file  Emotionally abused: Not on file    Physically abused: Not on file    Forced sexual activity: Not on file  Other Topics Concern  . Not on file  Social History Narrative   Divorced. Education: [employer]. Exercise: No.    Review of Systems: Positive for: GI: Described in detail in HPI.    Gen: fatigue, weakness, malaise,Denies any fever, chills, rigors, night sweats, anorexia,  involuntary weight loss, and sleep disorder VP:XTGGYIR, Denies chest pain, angina, palpitations, syncope, orthopnea, PND, peripheral edema, and claudication. Resp: Denies cough, sputum, wheezing, coughing up blood. GU : Denies urinary burning, blood in urine, urinary frequency, urinary hesitancy, nocturnal urination, and urinary incontinence. MS:leg swelling(takes furosemide), denies muscle weakness, cramps, atrophy.  Derm: Denies rash, itching, oral ulcerations, hives, unhealing ulcers.  Psych: Denies depression, anxiety, memory loss, suicidal ideation, hallucinations,  and confusion. Heme: Denies bruising, bleeding, and enlarged lymph nodes. Neuro:  Denies any headaches, dizziness, paresthesias. Endo:  DM, Denies any problems with thyroid, adrenal function.  Physical Exam: Vital signs in last 24 hours: Temp:  [97.4 F (36.3 C)-99.4 F (37.4 C)] 98.1 F (36.7 C) (11/05 0748) Pulse Rate:  [72-86] 77 (11/05 0748) Resp:  [16-23] 17 (11/05 0533) BP: (108-154)/(54-97) 131/91 (11/05 0748) SpO2:  [96 %-100 %] 100 % (11/05 0748) Last BM Date: 08/07/18  General:   Alert,  Well-developed, overweight, pleasant and cooperative in  NAD Head:  Normocephalic and atraumatic. Eyes:  Sclera clear, no icterus.   Mild pallor Ears:  Normal auditory acuity. Nose:  No deformity, discharge,  or lesions. Mouth:  No deformity or lesions.  Oropharynx pink & moist. Neck:  Supple; no masses or thyromegaly. Lungs:  Clear throughout to auscultation.   No wheezes, crackles, or rhonchi. No acute distress. Heart:  Regular rate and rhythm; no murmurs, clicks, rubs,  or gallops. Extremities:  Without clubbing or edema. Neurologic:  Alert and  oriented x4;  grossly normal neurologically. Skin:  Intact without significant lesions or rashes. Psych:  Alert and cooperative. Normal mood and affect. Abdomen:  Soft, nontender and nondistended. No masses, hepatosplenomegaly or hernias noted. Normal bowel sounds, without guarding, and without rebound.         Lab Results: Recent Labs    08/07/18 1149 08/07/18 1348 08/08/18 0238  WBC 5.7 5.6 4.8  HGB 6.3* 6.2* 6.6*  HCT 20.9* 23.2* 22.9*  PLT  --  414* 325   BMET Recent Labs    08/07/18 1348 08/08/18 0238  NA 140 139  K 4.5 4.2  CL 108 109  CO2 23 23  GLUCOSE 88 213*  BUN 33* 30*  CREATININE 1.34* 1.37*  CALCIUM 9.2 8.7*   LFT Recent Labs    08/07/18 1348  PROT 6.7  ALBUMIN 3.7  AST 16  ALT 12  ALKPHOS 108  BILITOT 0.7   PT/INR Recent Labs    08/08/18 0238  LABPROT 14.0  INR 1.09    Studies/Results: Dg Chest Port 1 View  Result Date: 08/08/2018 CLINICAL DATA:  72 year old female with shortness of breath with activity. Cough in mornings. Initial encounter. EXAM: PORTABLE CHEST 1 VIEW COMPARISON:  10/03/2017 and 03/26/2013 chest x-ray. FINDINGS: Heart slightly enlarged. Chronic slightly elevated right hemidiaphragm. No infiltrate, congestive heart failure or pneumothorax. Minimal linear scarring adjacent left heart border. No plain film evidence of pulmonary malignancy. Calcified slightly tortuous aorta. Bilateral acromioclavicular joint degenerative changes.  IMPRESSION: 1. Heart slightly enlarged. 2. No acute pulmonary abnormality noted as discussed above. 3.  Aortic Atherosclerosis (ICD10-I70.0).  Electronically Signed   By: Genia Del M.D.   On: 08/08/2018 08:59    Impression: Severe iron deficiency anemia,hemoglobin 6.3 on presentation with an MCV of 68,iron saturation 4%, ferritin 7 and an elevated TIBC 385.FOBT was negative.Patient has received 2 units PRBC transfusion Slightly elevated BUN/creatinine ratio of 33/1.34, however patient has chronic kidney disease with a GFR of 38  Plan: Severe iron deficiency anemia without obvious melena or hematochezia. Recommend diagnostic EGD with small bowel biopsies and colonoscopy in a.m. Discussed about the risks and benefits of the procedure with the patient in details. She understands and verbalizes consent. She remains hemodynamically stable.   LOS: 1 day   Ronnette Juniper, M.D.  08/08/2018, 9:38 AM  Pager [phone] If no answer or after 5 PM call [phone]

## 2018-08-09 ENCOUNTER — Encounter (HOSPITAL_COMMUNITY)
Admission: EM | Disposition: A | Discharge status: Home / Self Care | Payer: Self-pay | Source: Home / Self Care | Attending: Family Medicine

## 2018-08-09 ENCOUNTER — Encounter (HOSPITAL_COMMUNITY): Payer: Self-pay

## 2018-08-09 ENCOUNTER — Inpatient Hospital Stay (HOSPITAL_COMMUNITY): Payer: PPO | Admitting: Anesthesiology

## 2018-08-09 DIAGNOSIS — N179 Acute kidney failure, unspecified: Secondary | ICD-10-CM

## 2018-08-09 DIAGNOSIS — N183 Chronic kidney disease, stage 3 unspecified: Secondary | ICD-10-CM

## 2018-08-09 DIAGNOSIS — R0602 Shortness of breath: Secondary | ICD-10-CM

## 2018-08-09 HISTORY — PX: HOT HEMOSTASIS: SHX5433

## 2018-08-09 HISTORY — PX: COLONOSCOPY WITH PROPOFOL: SHX5780

## 2018-08-09 HISTORY — PX: BIOPSY: SHX5522

## 2018-08-09 HISTORY — PX: ESOPHAGOGASTRODUODENOSCOPY (EGD) WITH PROPOFOL: SHX5813

## 2018-08-09 LAB — TYPE AND SCREEN
ABO/RH(D): O POS
ANTIBODY SCREEN: NEGATIVE
UNIT DIVISION: 0
Unit division: 0

## 2018-08-09 LAB — BPAM RBC
BLOOD PRODUCT EXPIRATION DATE: 201912012359
Blood Product Expiration Date: 201912032359
ISSUE DATE / TIME: 201911041951
ISSUE DATE / TIME: 201911050501
UNIT TYPE AND RH: 5100
UNIT TYPE AND RH: 5100

## 2018-08-09 LAB — CBC
HCT: 27.9 % — ABNORMAL LOW (ref 36.0–46.0)
Hemoglobin: 8 g/dL — ABNORMAL LOW (ref 12.0–15.0)
MCH: 20.6 pg — AB (ref 26.0–34.0)
MCHC: 28.7 g/dL — AB (ref 30.0–36.0)
MCV: 71.9 fL — AB (ref 80.0–100.0)
PLATELETS: 341 10*3/uL (ref 150–400)
RBC: 3.88 MIL/uL (ref 3.87–5.11)
RDW: 22.7 % — AB (ref 11.5–15.5)
WBC: 5.6 10*3/uL (ref 4.0–10.5)
nRBC: 0 % (ref 0.0–0.2)

## 2018-08-09 LAB — BASIC METABOLIC PANEL
ANION GAP: 5 (ref 5–15)
BUN: 18 mg/dL (ref 8–23)
CALCIUM: 9 mg/dL (ref 8.9–10.3)
CO2: 24 mmol/L (ref 22–32)
CREATININE: 1.06 mg/dL — AB (ref 0.44–1.00)
Chloride: 113 mmol/L — ABNORMAL HIGH (ref 98–111)
GFR calc Af Amer: 58 mL/min — ABNORMAL LOW (ref >60)
GFR, EST NON AFRICAN AMERICAN: 50 mL/min — AB (ref >60)
GLUCOSE: 98 mg/dL (ref 70–99)
Potassium: 4.2 mmol/L (ref 3.5–5.1)
Sodium: 142 mmol/L (ref 135–145)

## 2018-08-09 LAB — GLUCOSE, CAPILLARY
GLUCOSE-CAPILLARY: 91 mg/dL (ref 70–99)
Glucose-Capillary: 93 mg/dL (ref 70–99)

## 2018-08-09 SURGERY — ESOPHAGOGASTRODUODENOSCOPY (EGD) WITH PROPOFOL
Anesthesia: Monitor Anesthesia Care

## 2018-08-09 MED ORDER — POLYETHYLENE GLYCOL 3350 17 G PO PACK: 17.0000 g | PACK | Freq: Every day | ORAL | 0 refills | Status: DC | PRN

## 2018-08-09 MED ORDER — PROPOFOL 500 MG/50ML IV EMUL
INTRAVENOUS | Status: DC | PRN
  Administered 2018-08-09: 09:00:00 via INTRAVENOUS
  Administered 2018-08-09: 100 ug/kg/min via INTRAVENOUS

## 2018-08-09 MED ORDER — FERROUS SULFATE 325 (65 FE) MG PO TABS: 325.0000 mg | ORAL_TABLET | Freq: Every day | ORAL | 0 refills | Status: DC

## 2018-08-09 MED ORDER — PANTOPRAZOLE SODIUM 40 MG PO TBEC: 40.0000 mg | DELAYED_RELEASE_TABLET | Freq: Two times a day (BID) | ORAL | 0 refills | Status: DC

## 2018-08-09 MED ORDER — PANTOPRAZOLE SODIUM 40 MG PO TBEC: 40.0000 mg | DELAYED_RELEASE_TABLET | Freq: Two times a day (BID) | ORAL | Status: DC

## 2018-08-09 MED ORDER — LACTATED RINGERS IV SOLN
INTRAVENOUS | Status: DC
  Administered 2018-08-09: 1000 mL via INTRAVENOUS
  Administered 2018-08-09: 08:00:00 via INTRAVENOUS

## 2018-08-09 MED ORDER — FERROUS SULFATE 325 (65 FE) MG PO TABS: 325.0000 mg | ORAL_TABLET | Freq: Every day | ORAL | Status: DC

## 2018-08-09 MED ORDER — PROPOFOL 10 MG/ML IV BOLUS
INTRAVENOUS | Status: DC | PRN
  Administered 2018-08-09: 10 mg via INTRAVENOUS
  Administered 2018-08-09 (×2): 20 mg via INTRAVENOUS
  Administered 2018-08-09: 30 mg via INTRAVENOUS

## 2018-08-09 MED ORDER — LIDOCAINE 2% (20 MG/ML) 5 ML SYRINGE
INTRAMUSCULAR | Status: DC | PRN
  Administered 2018-08-09: 40 mg via INTRAVENOUS

## 2018-08-09 MED ORDER — SODIUM CHLORIDE 0.9 % IV SOLN: INTRAVENOUS | Status: DC

## 2018-08-09 SURGICAL SUPPLY — 25 items

## 2018-08-09 NOTE — Op Note (Addendum)
[facility] Patient Name: Erica Thomas Procedure Date : 08/09/2018 MRN: [id number] Attending MD: Ronnette Juniper , MD Date of Birth: 03-03-1946 CSN: [id number] Age: 73 Admit Type: Inpatient Procedure:                Colonoscopy Indications:              Last colonoscopy 10 years ago, Unexplained iron                            deficiency anemia Providers:                Ronnette Juniper, MD, Burtis Junes, RN, Cherylynn Ridges,                            Technician Referring MD:              Medicines:                Monitored Anesthesia Care Complications:            No immediate complications. Estimated blood loss:                            None. Estimated Blood Loss:     Estimated blood loss: none. Procedure:                Pre-Anesthesia Assessment:                           - Prior to the procedure, a History and Physical                            was performed, and patient medications and                            allergies were reviewed. The patient's tolerance of                            previous anesthesia was also reviewed. The risks                            and benefits of the procedure and the sedation                            options and risks were discussed with the patient.                            All questions were answered, and informed consent                            was obtained. Prior Anticoagulants: The patient has                            taken no previous anticoagulant or antiplatelet                            agents. ASA Grade Assessment: III - A  patient with                            severe systemic disease. After reviewing the risks                            and benefits, the patient was deemed in                            satisfactory condition to undergo the procedure.                           After obtaining informed consent, the colonoscope                            was passed under direct vision. Throughout the      procedure, the patient's blood pressure, pulse, and                            oxygen saturations were monitored continuously. The                            PCF-H190DL ([id number]) peds colon was introduced                            through the anus and advanced to the the cecum,                            identified by appendiceal orifice and ileocecal                            valve. The colonoscopy was performed without                            difficulty. The patient tolerated the procedure                            well. The quality of the bowel preparation was                            adequate to identify polyps 6 mm and larger in size. Scope In: 8:48:40 AM Scope Out: 9:10:11 AM Scope Withdrawal Time: 0 hours 15 minutes 15 seconds  Total Procedure Duration: 0 hours 21 minutes 31 seconds  Findings:      The perianal and digital rectal examinations were normal.      Scattered small and large-mouthed diverticula were found in the sigmoid       colon and descending colon.      A single small localized angioectasia with bleeding on contact was found       in the ascending colon. Coagulation for hemostasis using argon plasma at       0.5 liters/minute and 20 watts was successful.      Non-bleeding internal hemorrhoids were found during retroflexion. The       hemorrhoids were small.      The exam was otherwise without abnormality.  Impression:               - Diverticulosis in the sigmoid colon and in the                            descending colon.                           - A single colonic angioectasia. Treated with argon                            plasma coagulation (APC).                           - Non-bleeding internal hemorrhoids.                           - The examination was otherwise normal.                           - No specimens collected. Moderate Sedation:      Patient did not receive moderate sedation for this procedure, but       instead received monitored  anesthesia care. Recommendation:           - Resume regular diet.                           - Continue present medications.                           - Repeat colonoscopy in 10 years for screening                            purposes if general condition allows.                           - Iron supplementation. Procedure Code(s):        --- Professional ---                           [phone], Colonoscopy, flexible; with control of                            bleeding, any method Diagnosis Code(s):        --- Professional ---                           K64.8, Other hemorrhoids                           K55.20, Angiodysplasia of colon without hemorrhage                           D50.9, Iron deficiency anemia, unspecified                           K57.30, Diverticulosis of large intestine without  perforation or abscess without bleeding CPT copyright 2018 American Medical Association. All rights reserved. The codes documented in this report are preliminary and upon coder review may  be revised to meet current compliance requirements. Ronnette Juniper, MD 08/09/2018 9:24:26 AM This report has been signed electronically. Number of Addenda: 0

## 2018-08-09 NOTE — Interval H&P Note (Signed)
History and Physical Interval Note: 72/female with iron deficiency anemia for an EGD with small bowel biopsies and colonoscopy.  08/09/2018 8:18 AM  Erica Thomas  has presented today for EGD and colonoscopy, with the diagnosis of Iron deficiency anemia  The various methods of treatment have been discussed with the patient and family. After consideration of risks, benefits and other options for treatment, the patient has consented to  Procedure(s): ESOPHAGOGASTRODUODENOSCOPY (EGD) WITH PROPOFOL (N/A) COLONOSCOPY WITH PROPOFOL (N/A) as a surgical intervention .  The patient's history has been reviewed, patient examined, no change in status, stable for surgery.  I have reviewed the patient's chart and labs.  Questions were answered to the patient's satisfaction.     Ronnette Juniper

## 2018-08-09 NOTE — Anesthesia Procedure Notes (Signed)
Procedure Name: MAC Performed by: Valda Favia, CRNA Pre-anesthesia Checklist: Patient identified, Emergency Drugs available, Suction available, Patient being monitored and Timeout performed Patient Re-evaluated:Patient Re-evaluated prior to induction Oxygen Delivery Method: Nasal cannula Preoxygenation: Pre-oxygenation with 100% oxygen Induction Type: IV induction Airway Equipment and Method: Bite block Placement Confirmation: positive ETCO2 Dental Injury: Teeth and Oropharynx as per pre-operative assessment

## 2018-08-09 NOTE — Discharge Summary (Signed)
[facility] Discharge Summary  Patient name: Erica Thomas Medical record number: [id number] Date of birth: June 29, 1946 Age: 72 y.o. Gender: female Date of Admission: 08/07/2018  Date of Discharge: 08/09/2018 Admitting Physician: Dickie La, MD  Primary Care Provider: Patient, No Pcp Per Consultants: GI  Indication for Hospitalization: symptomatic anemia  Discharge Diagnoses/Problem List:  Acute microcytic anemia HTN DM2 HLD CKDIII Gout  Disposition: discharge to home with GI f/u for pathology  Discharge Condition: improved- stable  Discharge Exam:  General: resting comfortably, NAD Heart: RRR, no murmur appreciated Pulm: CTAB, no wheezes or increased WOB Abd: soft, non-tender, non-distended Derm: no rashes or lesions noted Neuro: alert and oriented, no focal deficits Psych: pleasant, cooperative  Brief Hospital Course:  Patient admitted after PCP labs showed iron deficiency anemia with Hgb 6.3- patient was admitted and received 2 upRBC increasing Hgb to 8.4. Patient denied frank blood per rectum or vaginal. GI was consulted and performed EGD and colonoscopy. EGD reveled non bleeding duodenal ulcers largest 13m with biopsies taken and 1 endo clip placed for hemostasis. Colonoscopy reveled bleeding AVM in ascending colon treated with APC. Diverticulosis was noted in sigmoid and descending colon as well as internal hemorrhoids. On day of discharge, patient had stable vital signs with hgb 8.0. Patient was started on PPI BID x4 weeks plus daily iron supplement with instructions to follow up with GI for biopsy results and further management.  Issues for Follow Up:  1. Will need follow up with GI for EGD biopsy result review. 2. Will need follow up CBC to monitor Hgb (8.0 day of discharge) as well as reviewing for signs of bleeding per rectum and continue to hold NSAIDs use 3. Will need follow up with cardiology for echo showing eEF 60-65%, G2DD, mild pulm  hypertension and trace aortic insufficiency  Significant Procedures: ECHO, EGD, colonoscopy  Significant Labs and Imaging:  Recent Labs  Lab 08/07/18 1348 08/08/18 0238 08/08/18 1111 08/09/18 0241  WBC 5.6 4.8  --  5.6  HGB 6.2* 6.6* 8.4* 8.0*  HCT 23.2* 22.9* 28.0* 27.9*  PLT 414* 325  --  341   Recent Labs  Lab 08/07/18 1348 08/08/18 0238 08/09/18 0241  NA 140 139 142  K 4.5 4.2 4.2  CL 108 109 113*  CO2 23 23 24   GLUCOSE 88 213* 98  BUN 33* 30* 18  CREATININE 1.34* 1.37* 1.06*  CALCIUM 9.2 8.7* 9.0  ALKPHOS 108  --   --   AST 16  --   --   ALT 12  --   --   ALBUMIN 3.7  --   --     Reticulocyte count 1.5% TIBC 385 Iron 17 Iron saturation 4 Ferritin 7  Results/Tests Pending at Time of Discharge: EGD biopsy results  Discharge Medications:  Allergies as of 08/09/2018      Reactions   Penicillins Hives   Childhood reaction Has patient had a PCN reaction causing immediate rash, facial/tongue/throat swelling, SOB or lightheadedness with hypotension: Yes Has patient had a PCN reaction causing severe rash involving mucus membranes or skin necrosis: No Has patient had a PCN reaction that required hospitalization: No Has patient had a PCN reaction occurring within the last 10 years: No If all of the above answers are "NO", then may proceed with Cephalosporin use.      Medication List    STOP taking these medications   furosemide 40 MG tablet Commonly known as:  LASIX   glimepiride  1 MG tablet Commonly known as:  AMARYL     TAKE these medications   acetaminophen 500 MG tablet Commonly known as:  TYLENOL Take 500 mg by mouth daily before breakfast.   allopurinol 100 MG tablet Commonly known as:  ZYLOPRIM Take 100 mg by mouth daily before supper.   atorvastatin 40 MG tablet Commonly known as:  LIPITOR Take 1 tablet (40 mg total) by mouth daily. What changed:  when to take this   blood glucose meter kit and supplies Kit Check fasting blood sugar  once daily. Use diagnosis code of E11.9.   ferrous sulfate 325 (65 FE) MG tablet Take 1 tablet (325 mg total) by mouth daily with breakfast.   lisinopril 20 MG tablet Commonly known as:  PRINIVIL,ZESTRIL Take 1 tablet (20 mg total) by mouth daily. What changed:    when to take this  Another medication with the same name was removed. Continue taking this medication, and follow the directions you see here.   Metoprolol Succinate 50 MG Cs24 Take 1 tablet by mouth daily. What changed:    how much to take  when to take this   [facility] DELICA LANCETS FINE Misc USE AS DIRECTED   ONETOUCH VERIO test strip Generic drug:  glucose blood USE ONCE DAILY *E11.9*   pantoprazole 40 MG tablet Commonly known as:  PROTONIX Take 1 tablet (40 mg total) by mouth 2 (two) times daily.   polyethylene glycol packet Commonly known as:  MIRALAX / GLYCOLAX Take 17 g by mouth daily as needed for mild constipation.       Discharge Instructions: Please refer to Patient Instructions section of EMR for full details.  Patient was counseled important signs and symptoms that should prompt return to medical care, changes in medications, dietary instructions, activity restrictions, and follow up appointments.   Follow-Up Appointments: Follow-up Information    Leonie Douglas, PA-C Follow up on 08/15/2018.   Specialties:  Physician Assistant, Family Medicine Why:  3:00 for hospital follow up apt. Contact information: [place] [address] [place], [place], DO 08/09/2018, 12:04 PM PGY-1, [place]

## 2018-08-09 NOTE — Anesthesia Postprocedure Evaluation (Signed)
Anesthesia Post Note  Patient: Jilda A Mcguinness  Procedure(s) Performed: ESOPHAGOGASTRODUODENOSCOPY (EGD) WITH PROPOFOL (N/A ) COLONOSCOPY WITH PROPOFOL (N/A ) BIOPSY HEMOSTASIS CLIP PLACEMENT HOT HEMOSTASIS (ARGON PLASMA COAGULATION/BICAP) (N/A )     Patient location during evaluation: PACU Anesthesia Type: MAC Level of consciousness: awake and alert Pain management: pain level controlled Vital Signs Assessment: post-procedure vital signs reviewed and stable Respiratory status: spontaneous breathing, nonlabored ventilation, respiratory function stable and patient connected to nasal cannula oxygen Cardiovascular status: stable and blood pressure returned to baseline Postop Assessment: no apparent nausea or vomiting Anesthetic complications: no    Last Vitals:  Vitals:   08/09/18 0930 08/09/18 1007  BP: (!) 170/82 (!) 148/82  Pulse: 69 70  Resp: (!) 21   Temp:  36.5 C  SpO2: 100% 100%    Last Pain:  Vitals:   08/09/18 1007  TempSrc: Oral  PainSc:                  Santi Troung

## 2018-08-09 NOTE — Brief Op Note (Signed)
08/07/2018 - 08/09/2018  9:24 AM  PATIENT:  Erica Thomas  72 y.o. female  PRE-OPERATIVE DIAGNOSIS:  Iron deficiency anemia  POST-OPERATIVE DIAGNOSIS:  hiatal hernia, duodenal ulcers, biopsy of duodenal polyp and ulcers, s/p hemastatic clip placment, right colon AVM s/p APC, diverticulosis, hemorrhoids  PROCEDURE:  Procedure(s): ESOPHAGOGASTRODUODENOSCOPY (EGD) WITH PROPOFOL (N/A) COLONOSCOPY WITH PROPOFOL (N/A) BIOPSY HEMOSTASIS CLIP PLACEMENT HOT HEMOSTASIS (ARGON PLASMA COAGULATION/BICAP) (N/A)  SURGEON:  Surgeon(s) and Role:    Ronnette Juniper, MD - Primary  PHYSICIAN ASSISTANT:   ASSISTANTS: Burtis Junes, RN, Janie Billups, Tech   ANESTHESIA:   MAC  EBL:  Minimal  BLOOD ADMINISTERED:none  DRAINS: none   LOCAL MEDICATIONS USED:  NONE  SPECIMEN:  Biopsy / Limited Resection  DISPOSITION OF SPECIMEN:  PATHOLOGY  COUNTS:  YES  TOURNIQUET:  * No tourniquets in log *  DICTATION: .Dragon Dictation  PLAN OF CARE: Admit to inpatient   PATIENT DISPOSITION:  PACU - hemodynamically stable.   Delay start of Pharmacological VTE agent (>24hrs) due to surgical blood loss or risk of bleeding: no

## 2018-08-09 NOTE — Transfer of Care (Signed)
Immediate Anesthesia Transfer of Care Note  Patient: Erica Thomas  Procedure(s) Performed: ESOPHAGOGASTRODUODENOSCOPY (EGD) WITH PROPOFOL (N/A ) COLONOSCOPY WITH PROPOFOL (N/A ) BIOPSY HEMOSTASIS CLIP PLACEMENT HOT HEMOSTASIS (ARGON PLASMA COAGULATION/BICAP) (N/A )  Patient Location: Endoscopy Unit  Anesthesia Type:MAC  Level of Consciousness: awake, alert  and oriented  Airway & Oxygen Therapy: Patient Spontanous Breathing  Post-op Assessment: Report given to RN and Post -op Vital signs reviewed and stable  Post vital signs: Reviewed and stable  Last Vitals:  Vitals Value Taken Time  BP    Temp 36.5 C 08/09/2018  9:18 AM  Pulse 76 08/09/2018  9:19 AM  Resp 18 08/09/2018  9:19 AM  SpO2 100 % 08/09/2018  9:19 AM  Vitals shown include unvalidated device data.  Last Pain:  Vitals:   08/09/18 0918  TempSrc: Oral  PainSc: 0-No pain         Complications: No apparent anesthesia complications

## 2018-08-09 NOTE — Op Note (Signed)
EGD and colonoscopy were performed for unexplained iron deficiency anemia.  Findings:  5 cm hiatal hernia. Mildly erythematous antrum, biopsies taken for H. Pylori. Normal appearing cardia and fundus on retroflexion. Small duodenal polyp, removed via biopsy polypectomy and sent to pathology. Erythematous duodenal bulb and first portion of the duodenum with few non bleeding superficial duodenal ulcers with clean base, largest 7 mm in size, biopsies taken, minimal oozing noted post biopsy, 1 Endo Clip placed for hemostasis. Biopsies taken from duodenum to rule out celiac disease.  A bleeding AVM noted in ascending colon, treated with APC at 0.5 L/m at 72 W. Diverticulosis noted in sigmoid and descending. Internal hemorrhoids noted on retroflexion.   Recommendations: Resume regular diet. PPI twice a day for 4 weeks. Await pathology, can be followed as an outpatient. Oral iron supplementation. Avoid NSAIDs if possible. Okay to discharge from GI standpoint.   Ronnette Juniper, M.D.

## 2018-08-09 NOTE — Op Note (Signed)
[facility] Patient Name: Erica Thomas Procedure Date : 08/09/2018 MRN: [id number] Attending MD: Ronnette Juniper , MD Date of Birth: [birth year] CSN: [id number] Age: 72 Admit Type: Inpatient Procedure:                Upper GI endoscopy Indications:              Unexplained iron deficiency anemia Providers:                Ronnette Juniper, MD, Burtis Junes, RN, Cherylynn Ridges,                            Technician, Edmonia James, CRNA Referring MD:              Medicines:                Monitored Anesthesia Care Complications:            No immediate complications. Estimated blood loss:                            Minimal. Estimated Blood Loss:     Estimated blood loss was minimal. Procedure:                Pre-Anesthesia Assessment:                           - Prior to the procedure, a History and Physical                            was performed, and patient medications and                            allergies were reviewed. The patient's tolerance of                            previous anesthesia was also reviewed. The risks                            and benefits of the procedure and the sedation                            options and risks were discussed with the patient.                            All questions were answered, and informed consent                            was obtained. Prior Anticoagulants: The patient has                            taken no previous anticoagulant or antiplatelet                            agents. ASA Grade Assessment: III - A patient with  severe systemic disease. After reviewing the risks                            and benefits, the patient was deemed in                            satisfactory condition to undergo the procedure.                           After obtaining informed consent, the endoscope was                            passed under direct vision. Throughout the                            procedure, the  patient's blood pressure, pulse, and                            oxygen saturations were monitored continuously. The                            [id number]-[id number] ([id number] Olympus Adult EGD was introduced                            through the mouth, and advanced to the second part                            of duodenum. The upper GI endoscopy was                            accomplished without difficulty. The patient                            tolerated the procedure well. Scope In: Scope Out: Findings:      A 5 cm hiatal hernia was present.      The Z-line was regular and was found 35 cm from the incisors.      The upper third of the esophagus, middle third of the esophagus and       lower third of the esophagus were normal.      Localized mildly erythematous mucosa without bleeding was found in the       gastric antrum. Biopsies were taken with a cold forceps for Helicobacter       pylori testing.      The cardia and gastric fundus were normal on retroflexion.      Few non-bleeding superficial duodenal ulcers with a clean ulcer base       (Forrest Class III) were found in the first portion of the duodenum. The       largest lesion was 7 mm in largest dimension. Biopsies for histology       were taken with a cold forceps for evaluation of celiac disease. One of       the sites of biopsy, near an ulcer starting to ooze small amount of       fresh blood.      To stop active bleeding, one  hemostatic clip was successfully placed in       the first portion of the duodenum. There was no bleeding at the end of       the procedure.      A single 5 mm sessile polyp with no bleeding was found in the first       portion of the duodenum. The polyp was removed with a cold biopsy       forceps. Resection and retrieval were complete. Impression:               - 5 cm hiatal hernia.                           - Z-line regular, 35 cm from the incisors.                           - Normal upper third of  esophagus, middle third of                            esophagus and lower third of esophagus.                           - Erythematous mucosa in the antrum. Biopsied.                           - Multiple non-bleeding duodenal ulcers with a                            clean ulcer base (Forrest Class III). Biopsied.                           - A single duodenal polyp. Resected and retrieved.                           - One hemostatic clip was successfully placed in                            the first portion of the duodenum. Moderate Sedation:      Patient did not receive moderate sedation for this procedure, but       instead received monitored anesthesia care. Recommendation:           - Resume regular diet.                           - Continue present medications.                           - Await pathology results.                           - Use Protonix (pantoprazole) 40 mg PO BID for 4                            weeks. Procedure Code(s):        --- Professional ---  43255, 59, Esophagogastroduodenoscopy, flexible,                            transoral; with control of bleeding, any method                           43239, Esophagogastroduodenoscopy, flexible,                            transoral; with biopsy, single or multiple Diagnosis Code(s):        --- Professional ---                           K44.9, Diaphragmatic hernia without obstruction or                            gangrene                           K31.89, Other diseases of stomach and duodenum                           K26.9, Duodenal ulcer, unspecified as acute or                            chronic, without hemorrhage or perforation                           K31.7, Polyp of stomach and duodenum                           D50.9, Iron deficiency anemia, unspecified CPT copyright 2018 American Medical Association. All rights reserved. The codes documented in this report are preliminary and upon  coder review may  be revised to meet current compliance requirements. Ronnette Juniper, MD 08/09/2018 9:21:06 AM This report has been signed electronically. Number of Addenda: 0

## 2018-08-10 NOTE — Consult Note (Signed)
            [facility] CM Primary Care Navigator  08/10/2018  Journii Nierman Stickels 04-Feb-1946 [id number]   Attempt to see patient at the bedside to identify possible discharge needs butshewas already discharged home.  Per MD note,patient was admitted after PCP- primary care provider labs showed iron deficiency anemia with Hgb 6.3- received 2 units PRBC. GI was consulted and performed EGD- esophagogastroduodenoscopy and colonoscopy.  Patient has discharge instruction to follow-up withprimary care provider Tenna Delaine, [facility]) on 08/15/18.  Primary care provider's office is listed as providing transition of care (TOC) follow-up.   For additional questions please contact:  Edwena Felty A. Claudie Brickhouse, BSN, RN-BC [facility] PRIMARY CARE Navigator Cell: [phone]

## 2018-08-12 ENCOUNTER — Encounter (HOSPITAL_COMMUNITY): Payer: Self-pay | Admitting: Gastroenterology

## 2018-08-15 ENCOUNTER — Ambulatory Visit (INDEPENDENT_AMBULATORY_CARE_PROVIDER_SITE_OTHER): Payer: PPO | Admitting: Physician Assistant

## 2018-08-15 ENCOUNTER — Encounter: Payer: Self-pay | Admitting: Physician Assistant

## 2018-08-15 ENCOUNTER — Other Ambulatory Visit: Payer: Self-pay

## 2018-08-15 VITALS — BP 152/80 | HR 78 | Temp 98.0°F | Resp 20 | Ht 61.65 in | Wt 203.2 lb

## 2018-08-15 DIAGNOSIS — N183 Chronic kidney disease, stage 3 unspecified: Secondary | ICD-10-CM

## 2018-08-15 DIAGNOSIS — D649 Anemia, unspecified: Secondary | ICD-10-CM

## 2018-08-15 DIAGNOSIS — I10 Essential (primary) hypertension: Secondary | ICD-10-CM | POA: Diagnosis not present

## 2018-08-15 DIAGNOSIS — K269 Duodenal ulcer, unspecified as acute or chronic, without hemorrhage or perforation: Secondary | ICD-10-CM | POA: Diagnosis not present

## 2018-08-15 DIAGNOSIS — E119 Type 2 diabetes mellitus without complications: Secondary | ICD-10-CM

## 2018-08-15 LAB — POCT CBC
Granulocyte percent: 61.5 %G (ref 37–80)
HCT, POC: 28.8 % — AB (ref 29–41)
HEMOGLOBIN: 9.1 g/dL — AB (ref 9.5–13.5)
Lymph, poc: 1.5 (ref 0.6–3.4)
MCH: 22.2 pg — AB (ref 27–31.2)
MCHC: 31.5 g/dL — AB (ref 31.8–35.4)
MCV: 70.4 fL — AB (ref 76–111)
MID (CBC): 0.5 (ref 0–0.9)
MPV: 6.9 fL (ref 0–99.8)
PLATELET COUNT, POC: 395 10*3/uL (ref 142–424)
POC Granulocyte: 3.2 (ref 2–6.9)
POC LYMPH PERCENT: 29.5 %L (ref 10–50)
POC MID %: 9 % (ref 0–12)
RBC: 4.1 M/uL (ref 4.04–5.48)
RDW, POC: 27.6 %
WBC: 5.2 10*3/uL (ref 4.6–10.2)

## 2018-08-15 NOTE — Progress Notes (Signed)
MRN: [id number] DOB: 01/10/1946  Subjective:   Erica Thomas is a 72 y.o. female presenting for hospital follow-up.  Admitted for symptomatic anemia on 08/07/2018.  Discharge 08/09/2018.  Colonoscopy revealed bleeding AVM in the ascending colon treated with APC.  Diverticulosis was noted in sigmoid descending colon as well as internal hemorrhoids.  She was transfused.  Hemoglobin at discharge was 8.0.  Started on PPI twice daily times per week plus iron supplementation.  Encouraged to follow-up with GI.  Today, she reports she is feeling much better.  She did not realize how bad she felt until she was admitted and treated.  Notes the shortness of breath sensation, fatigue, and overall not feeling well has resolved.  Did note that he discontinued her glimepiride for diabetes and Lasix for lower leg swelling (which was started by nephrology) in the hospital.  Sugars have been running around 114 since she has been discharged.  She has not heard from GI referral.  Lower leg swelling is beginning to return since she has not been on Lasix.  Has not been checking her blood pressure at home.  Denies melena, hematochezia,hematemesis, lightheadedness, dizziness, chronic headache, double vision, chest pain, shortness of breath, heart racing, palpitations, nausea, vomiting, abdominal pain, hematuria. . Denies any other aggravating or relieving factors, no other questions or concerns.  Erica Thomas has a current medication list which includes the following prescription(s): acetaminophen, allopurinol, atorvastatin, blood glucose meter kit and supplies, ferrous sulfate, lisinopril, metoprolol succinate, onetouch delica lancets fine, onetouch verio, pantoprazole, and polyethylene glycol. Also is allergic to penicillins.  Erica Thomas  has a past medical history of Arthritis, Blood transfusion without reported diagnosis, Cataract, Diabetes mellitus without complication ([place]), Hyperlipidemia, and Hypertension. Also  has a past surgical  history that includes Appendectomy; Abdominal hysterectomy (1974); Colonoscopy; Tonsillectomy; Spinal cord decompression (03/28/2013); Esophagogastroduodenoscopy (egd) with propofol (N/A, 08/09/2018); Colonoscopy with propofol (N/A, 08/09/2018); biopsy (08/09/2018); and Hot hemostasis (N/A, 08/09/2018).   Objective:   Vitals: BP (!) 170/82   Pulse 78   Temp 98 F (36.7 C) (Oral)   Resp 20   Ht 5' 1.65" (1.566 m)   Wt 203 lb 3.2 oz (92.2 kg)   SpO2 96%   BMI 37.58 kg/m   Physical Exam  Constitutional: She is oriented to person, place, and time. She appears well-developed and well-nourished. No distress.  HENT:  Head: Normocephalic and atraumatic.  Eyes: Conjunctivae are normal.  Neck: Normal range of motion.  Cardiovascular: Normal rate, regular rhythm and normal heart sounds.  Pulmonary/Chest: Effort normal and breath sounds normal.  Musculoskeletal:       Right lower leg: She exhibits edema (1+ edema to midshin).       Left lower leg: She exhibits edema (1+ edema to midshin).  Neurological: She is alert and oriented to person, place, and time.  Skin: Skin is warm and dry.  Psychiatric: She has a normal mood and affect.  Vitals reviewed.   Results for orders placed or performed in visit on 08/15/18 (from the past 24 hour(s))  POCT CBC     Status: Abnormal   Collection Time: 08/15/18  3:29 PM  Result Value Ref Range   WBC 5.2 4.6 - 10.2 K/uL   Lymph, poc 1.5 0.6 - 3.4   POC LYMPH PERCENT 29.5 10 - 50 %L   MID (cbc) 0.5 0 - 0.9   POC MID % 9.0 0 - 12 %M   POC Granulocyte 3.2 2 - 6.9   Granulocyte percent  61.5 37 - 80 %G   RBC 4.10 4.04 - 5.48 M/uL   Hemoglobin 9.1 (A) 9.5 - 13.5 g/dL   HCT, POC 28.8 (A) 29 - 41 %   MCV 70.4 (A) 76 - 111 fL   MCH, POC 22.2 (A) 27 - 31.2 pg   MCHC 31.5 (A) 31.8 - 35.4 g/dL   RDW, POC 27.6 %   Platelet Count, POC 395 142 - 424 K/uL   MPV 6.9 0 - 99.8 fL       Assessment and Plan :  1. Symptomatic anemia Sx have resolved. Pt is  feeling much better. Hgb has improved to 9.1 since discharge. Rec continuing tx plan and f/u with GI. Referral placed.  - POCT CBC - Basic metabolic panel - Ambulatory referral to Gastroenterology  2. Duodenal ulcer - Ambulatory referral to Gastroenterology  3. Essential hypertension Uncontrolled in office. Pt has only taken lisinopril 72m today, will often take 333m She is asymptomatic. Instructed to check bp outside of office over the next couple of weeks. Return if consistently >140/90. Given strict ED precautions.   4. Type 2 diabetes mellitus without complication, without long-term current use of insulin (HCLymanCan continue holding off on glimeperide at this time as A1C is well controlled. F/u in 3 months for reevalution. If A1C >7.0 at that time, consider safer alternative to sulfonylureas such as DPP4 (since she has CKD).   5. CKD (chronic kidney disease), stage III (HCC) BMP pending. If creatinine at baseline, can resume lasix.  Erica DelainePA-C  [facility] 08/15/2018 4:00 PM

## 2018-08-15 NOTE — Patient Instructions (Addendum)
Continue medications as you are. In terms of elevated blood pressure, I would like you to check your blood pressure at least a couple times over the next week outside of the office and document these values. It is best if you check the blood pressure at different times in the day. Your goal is <140/90. If your values are consistently above this goal, please return to office for further evaluation. If you start to have chest pain, blurred vision, shortness of breath, severe headache, lower leg swelling, or nausea/vomiting please seek care immediately here or at the ED.   Contact cardiology and nephrology and let them know you had a recent admission.    I have placed a referral for GI, they should contact you within 2 weeks.     If you have lab work done today you will be contacted with your lab results within the next 2 weeks.  If you have not heard from [place] then please contact us. The fastest way to get your results is to register for My Chart.   IF you received an x-ray today, you will receive an invoice from [facility]. Please contact [facility] at [phone] with questions or concerns regarding your invoice.   IF you received labwork today, you will receive an invoice from [facility]. Please contact [facility] at [phone] with questions or concerns regarding your invoice.   Our billing staff will not be able to assist you with questions regarding bills from these companies.  You will be contacted with the lab results as soon as they are available. The fastest way to get your results is to activate your My Chart account. Instructions are located on the last page of this paperwork. If you have not heard from [place] regarding the results in 2 weeks, please contact this office.

## 2018-08-16 LAB — BASIC METABOLIC PANEL
BUN / CREAT RATIO: 18 (ref 12–28)
BUN: 20 mg/dL (ref 8–27)
CO2: 21 mmol/L (ref 20–29)
CREATININE: 1.11 mg/dL — AB (ref 0.57–1.00)
Calcium: 8.9 mg/dL (ref 8.7–10.3)
Chloride: 110 mmol/L — ABNORMAL HIGH (ref 96–106)
GFR, EST AFRICAN AMERICAN: 57 mL/min/{1.73_m2} — AB (ref >59)
GFR, EST NON AFRICAN AMERICAN: 49 mL/min/{1.73_m2} — AB (ref >59)
Glucose: 95 mg/dL (ref 65–99)
Potassium: 4.6 mmol/L (ref 3.5–5.2)
Sodium: 145 mmol/L — ABNORMAL HIGH (ref 134–144)

## 2018-08-17 ENCOUNTER — Encounter: Payer: Self-pay | Admitting: Physician Assistant

## 2018-08-28 ENCOUNTER — Other Ambulatory Visit: Payer: Self-pay | Admitting: Urgent Care

## 2018-08-30 ENCOUNTER — Other Ambulatory Visit: Payer: Self-pay | Admitting: Family Medicine

## 2018-08-30 ENCOUNTER — Encounter: Payer: Self-pay | Admitting: Physician Assistant

## 2018-08-30 ENCOUNTER — Other Ambulatory Visit: Payer: Self-pay | Admitting: Urgent Care

## 2018-09-05 ENCOUNTER — Encounter: Payer: Self-pay | Admitting: Family Medicine

## 2018-09-06 ENCOUNTER — Telehealth: Payer: Self-pay | Admitting: Emergency Medicine

## 2018-09-06 NOTE — Telephone Encounter (Signed)
mychart message sent to pt about rescheduling their appt on 02/06/19

## 2018-10-01 ENCOUNTER — Other Ambulatory Visit (HOSPITAL_COMMUNITY): Payer: Self-pay | Admitting: Family Medicine

## 2018-11-28 ENCOUNTER — Other Ambulatory Visit: Payer: Self-pay

## 2018-11-28 ENCOUNTER — Ambulatory Visit (INDEPENDENT_AMBULATORY_CARE_PROVIDER_SITE_OTHER): Payer: PPO | Admitting: Family Medicine

## 2018-11-28 ENCOUNTER — Encounter: Payer: Self-pay | Admitting: Family Medicine

## 2018-11-28 ENCOUNTER — Telehealth: Payer: Self-pay | Admitting: Family Medicine

## 2018-11-28 VITALS — BP 149/83 | HR 76 | Temp 98.4°F | Ht 61.65 in | Wt 201.0 lb

## 2018-11-28 DIAGNOSIS — Z862 Personal history of diseases of the blood and blood-forming organs and certain disorders involving the immune mechanism: Secondary | ICD-10-CM

## 2018-11-28 DIAGNOSIS — I10 Essential (primary) hypertension: Secondary | ICD-10-CM | POA: Diagnosis not present

## 2018-11-28 DIAGNOSIS — E119 Type 2 diabetes mellitus without complications: Secondary | ICD-10-CM

## 2018-11-28 DIAGNOSIS — D649 Anemia, unspecified: Secondary | ICD-10-CM | POA: Diagnosis not present

## 2018-11-28 MED ORDER — SITAGLIPTIN PHOSPHATE 50 MG PO TABS: 50.0000 mg | ORAL_TABLET | Freq: Every day | ORAL | 2 refills | Status: DC

## 2018-11-28 NOTE — Progress Notes (Signed)
2/25/20208:22 AM  Erica Thomas [birth year], 73 y.o. female [id number]  Chief Complaint  Patient presents with  . Hypertension    brought log for bp and glucose readings  . Diabetes    eye sxam scheduled    HPI:   Patient is a 72 y.o. female with past medical history significant for DM2, HTN, CKD3, anemia from ulcer and bleeding AVM who presents today for routine followup  Previous PCP Timmothy Euler, PA-C Last OV Nov 2019 Since last OV she has resumed her lasix 1m once a day She used to be on glimperide but had to stop due to hypogylecemia Brings in logs for both fasting BP and glucose  Fasting 7 day avg 156 She has 2 random afternoon cbgs > 200s BP 140s/70s She takes lisinopril 255m lasix 405mnd metoprolol 33m63m She takes an extra 10mg40mlisinopril if her SBP > 150 for couple days at a time  Sees renal in march Sees cards in June Will be seeing ophtho in march  Patient Care Team: Patient, No Pcp Per as PCP - General (General Practice) GanjiAdrian Prowsas Consulting Physician (Cardiology) BhandRosita Fireas Consulting Physician (Nephrology)  Lab Results  Component Value Date   HGBA1C 6.6 (A) 08/04/2018   HGBA1C 7.4 01/27/2018   HGBA1C 6.4 (H) 10/03/2017   Lab Results  Component Value Date   LDLCALC 75 08/04/2018   CREATININE 1.11 (H) 08/15/2018   Lab Results  Component Value Date   WBC 5.2 08/15/2018   HGB 9.1 (A) 08/15/2018   HCT 28.8 (A) 08/15/2018   MCV 70.4 (A) 08/15/2018   PLT 341 08/09/2018    Fall Risk  11/28/2018 08/15/2018 08/04/2018 03/17/2018 03/08/2018  Falls in the past year? 0 1 1 No No  Number falls in past yr: 0 1 - - -  Injury with Fall? 0 1 - - -  Comment - knees - - -     Depression screen PHQ [facility] 08/15/2018 08/04/2018  Decreased Interest 0 0 0  Down, Depressed, Hopeless 0 0 0  PHQ - 2 Score 0 0 0    Allergies  Allergen Reactions  . Penicillins Hives    Childhood reaction Has patient had a PCN reaction  causing immediate rash, facial/tongue/throat swelling, SOB or lightheadedness with hypotension: Yes Has patient had a PCN reaction causing severe rash involving mucus membranes or skin necrosis: No Has patient had a PCN reaction that required hospitalization: No Has patient had a PCN reaction occurring within the last 10 years: No If all of the above answers are "NO", then may proceed with Cephalosporin use.    Prior to Admission medications   Medication Sig Start Date End Date Taking? Authorizing Provider  allopurinol (ZYLOPRIM) 100 MG tablet Take 100 mg by mouth daily before supper.  02/13/18  Yes [provider]  atorvastatin (LIPITOR) 40 MG tablet Take 1 tablet (40 mg total) by mouth daily. Patient taking differently: Take 40 mg by mouth daily before supper.  08/04/18  Yes WisemTimmothy EulerttTanzaniaA-C  blood glucose meter kit and supplies KIT Check fasting blood sugar once daily. Use diagnosis code of E11.9. 03/08/18  Yes Mani,Jaynee EaglesC  lisinopril (PRINIVIL,ZESTRIL) 20 MG tablet Take 1 tablet (20 mg total) by mouth daily. Patient taking differently: Take 20 mg by mouth daily before supper.  08/04/18  Yes WisemTenna DelaineA-C  Metoprolol Succinate 50 MG CS24 Take 1 tablet by mouth daily. Patient taking differently: Take  50 mg by mouth daily before breakfast.  08/04/18  Yes Leonie Douglas, PA-C  [facility] DELICA LANCETS FINE MISC USE AS DIRECTED 09/01/18  Yes Horald Pollen, MD  [facility] VERIO test strip USE ONCE DAILY *E11.9* 08/28/18  Yes Forrest Moron, MD    Past Medical History:  Diagnosis Date  . Arthritis   . Blood transfusion without reported diagnosis   . Cataract   . Diabetes mellitus without complication ([place])    control by diet only  . Hyperlipidemia   . Hypertension     Past Surgical History:  Procedure Laterality Date  . ABDOMINAL HYSTERECTOMY  1974   Class V PAP  . APPENDECTOMY    . BIOPSY  08/09/2018   Procedure: BIOPSY;  Surgeon: Ronnette Juniper, MD;  Location: [place];  Service: Gastroenterology;;  . COLONOSCOPY    . COLONOSCOPY WITH PROPOFOL N/A 08/09/2018   Procedure: COLONOSCOPY WITH PROPOFOL;  Surgeon: Ronnette Juniper, MD;  Location: [place];  Service: Gastroenterology;  Laterality: N/A;  . ESOPHAGOGASTRODUODENOSCOPY (EGD) WITH PROPOFOL N/A 08/09/2018   Procedure: ESOPHAGOGASTRODUODENOSCOPY (EGD) WITH PROPOFOL;  Surgeon: Ronnette Juniper, MD;  Location: [place];  Service: Gastroenterology;  Laterality: N/A;  . HOT HEMOSTASIS N/A 08/09/2018   Procedure: HOT HEMOSTASIS (ARGON PLASMA COAGULATION/BICAP);  Surgeon: Ronnette Juniper, MD;  Location: [place];  Service: Gastroenterology;  Laterality: N/A;  . SPINAL CORD DECOMPRESSION  03/28/2013   L5 S1   Dr Rolena Infante  . TONSILLECTOMY      Social History   Tobacco Use  . Smoking status: Never Smoker  . Smokeless tobacco: Never Used  Substance Use Topics  . Alcohol use: No    Family History  Problem Relation Age of Onset  . Stroke Mother   . Diabetes Mother   . Heart disease Mother   . Hyperlipidemia Mother   . Heart disease Father   . Cancer Sister        Ovarian and stomach cancer  . Diabetes Maternal Grandmother   . Heart disease Maternal Grandmother   . Cancer Maternal Grandfather   . Arthritis Paternal Grandmother     Review of Systems  Constitutional: Negative for chills and fever.  Respiratory: Negative for cough and shortness of breath.   Cardiovascular: Negative for chest pain, palpitations and leg swelling.  Gastrointestinal: Negative for abdominal pain, nausea and vomiting.  Neurological: Positive for headaches.     OBJECTIVE:  Blood pressure (!) 149/83, pulse 76, temperature 98.4 F (36.9 C), temperature source Oral, height 5' 1.65" (1.566 m), weight 201 lb (91.2 kg), SpO2 99 %. Body mass index is 37.18 kg/m.   Physical Exam Vitals signs and nursing note reviewed.  Constitutional:      Appearance: She is well-developed.  HENT:     Head:  Normocephalic and atraumatic.     Mouth/Throat:     Pharynx: No oropharyngeal exudate.  Eyes:     General: No scleral icterus.    Conjunctiva/sclera: Conjunctivae normal.     Pupils: Pupils are equal, round, and reactive to light.  Neck:     Musculoskeletal: Neck supple.  Cardiovascular:     Rate and Rhythm: Normal rate and regular rhythm.     Heart sounds: Normal heart sounds. No murmur. No friction rub. No gallop.   Pulmonary:     Effort: Pulmonary effort is normal.     Breath sounds: Normal breath sounds. No wheezing or rales.  Musculoskeletal:     Right lower leg: No edema.  Left lower leg: No edema.  Skin:    General: Skin is warm and dry.  Neurological:     Mental Status: She is alert and oriented to person, place, and time.     ASSESSMENT and PLAN  1. Essential hypertension Slightly above goal. Consider increasing lisinopril to 61m, but will defer to renal whom she sees soon - CMP14+EGFR  2. Type 2 diabetes mellitus without complication, without long-term current use of insulin (HCC) cbgs have slowly been increasing. Starting jTonga renaly dose, reviewed r/se/b, as less risk of hypolycemia - Microalbumin / creatinine urine ratio - CMP14+EGFR  3. H/O iron deficiency anemia Checking labs today. Has h/o ACD 2/2 CKD - CBC with Differential/Platelet  Other orders - furosemide (LASIX) 40 MG tablet; Take 40 mg by mouth daily. - sitaGLIPtin (JANUVIA) 50 MG tablet; Take 1 tablet (50 mg total) by mouth daily.    Return in about 3 months (around 02/26/2019).    IRutherford Guys MD [facility] [place] [id number].  [phone] [phone]

## 2018-11-28 NOTE — Telephone Encounter (Signed)
Copied from [place] [id number]. Topic: Quick Communication - See Telephone Encounter >> Nov 28, 2018  5:23 PM Valla Leaver wrote: CRM for notification. See Telephone encounter for: 11/28/18. After her appointment today the doctor forgot to call in [place] VERIO test strip to [facility] on [address] [place]

## 2018-11-29 LAB — CMP14+EGFR
ALT: 12 [IU]/L (ref 0–32)
AST: 12 [IU]/L (ref 0–40)
Albumin/Globulin Ratio: 2.1 (ref 1.2–2.2)
Albumin: 4.2 g/dL (ref 3.7–4.7)
Alkaline Phosphatase: 169 [IU]/L — ABNORMAL HIGH (ref 39–117)
BUN/Creatinine Ratio: 24 (ref 12–28)
BUN: 26 mg/dL (ref 8–27)
Bilirubin Total: 0.5 mg/dL (ref 0.0–1.2)
CO2: 22 mmol/L (ref 20–29)
Calcium: 9.4 mg/dL (ref 8.7–10.3)
Chloride: 102 mmol/L (ref 96–106)
Creatinine, Ser: 1.07 mg/dL — ABNORMAL HIGH (ref 0.57–1.00)
GFR calc Af Amer: 59 mL/min/{1.73_m2} — ABNORMAL LOW
GFR calc non Af Amer: 51 mL/min/{1.73_m2} — ABNORMAL LOW
Globulin, Total: 2 g/dL (ref 1.5–4.5)
Glucose: 164 mg/dL — ABNORMAL HIGH (ref 65–99)
Potassium: 4.5 mmol/L (ref 3.5–5.2)
Sodium: 139 mmol/L (ref 134–144)
Total Protein: 6.2 g/dL (ref 6.0–8.5)

## 2018-11-29 LAB — CBC WITH DIFFERENTIAL/PLATELET
Basophils Absolute: 0 10*3/uL (ref 0.0–0.2)
Basos: 1 %
EOS (ABSOLUTE): 0.2 10*3/uL (ref 0.0–0.4)
Eos: 3 %
Hematocrit: 36 % (ref 34.0–46.6)
Hemoglobin: 12.1 g/dL (ref 11.1–15.9)
Immature Grans (Abs): 0 10*3/uL (ref 0.0–0.1)
Immature Granulocytes: 0 %
Lymphocytes Absolute: 1.3 10*3/uL (ref 0.7–3.1)
Lymphs: 24 %
MCH: 28.9 pg (ref 26.6–33.0)
MCHC: 33.6 g/dL (ref 31.5–35.7)
MCV: 86 fL (ref 79–97)
Monocytes Absolute: 0.5 10*3/uL (ref 0.1–0.9)
Monocytes: 9 %
Neutrophils Absolute: 3.4 10*3/uL (ref 1.4–7.0)
Neutrophils: 63 %
Platelets: 321 10*3/uL (ref 150–450)
RBC: 4.18 x10E6/uL (ref 3.77–5.28)
RDW: 14.7 % (ref 11.7–15.4)
WBC: 5.4 10*3/uL (ref 3.4–10.8)

## 2018-11-29 LAB — MICROALBUMIN / CREATININE URINE RATIO
Creatinine, Urine: 11.1 mg/dL
Microalb/Creat Ratio: <27 mg/g creat (ref 0–29)
Microalbumin, Urine: <3 ug/mL

## 2018-12-01 NOTE — Telephone Encounter (Signed)
Test strips and Lancets ordered

## 2019-01-01 ENCOUNTER — Other Ambulatory Visit: Payer: Self-pay | Admitting: Family Medicine

## 2019-01-01 NOTE — Telephone Encounter (Signed)
Requested Prescriptions  Pending Prescriptions Disp Refills  . ONETOUCH VERIO test strip [employer] Med Name: [place] TEST STRIP] 100 each 0    Sig: USE ONCE DAILY *E11.9*     Endocrinology: Diabetes - Testing Supplies Passed - 01/01/2019  4:13 PM      Passed - Valid encounter within last 12 months    Recent Outpatient Visits          1 month ago Essential hypertension   [facility] at [facility], Lilia Argue, MD   4 months ago Symptomatic anemia   Primary Care at [place], [place] D, PA-C   4 months ago Low hemoglobin   Primary Care at [place], [place] D, PA-C   5 months ago Type 2 diabetes mellitus without complication, without long-term current use of insulin [facility])   Primary Care at [place], [place] D, PA-C   9 months ago Peripheral edema   Primary Care at [place], [place]      Future Appointments            In 1 month  Primary Care at [place], [facility]   In 1 month Rutherford Guys, MD Primary Care at [place], [facility]

## 2019-01-23 DIAGNOSIS — M109 Gout, unspecified: Secondary | ICD-10-CM | POA: Diagnosis not present

## 2019-01-23 DIAGNOSIS — N183 Chronic kidney disease, stage 3 (moderate): Secondary | ICD-10-CM | POA: Diagnosis not present

## 2019-01-31 DIAGNOSIS — D509 Iron deficiency anemia, unspecified: Secondary | ICD-10-CM | POA: Diagnosis not present

## 2019-01-31 DIAGNOSIS — I129 Hypertensive chronic kidney disease with stage 1 through stage 4 chronic kidney disease, or unspecified chronic kidney disease: Secondary | ICD-10-CM | POA: Diagnosis not present

## 2019-01-31 DIAGNOSIS — N183 Chronic kidney disease, stage 3 (moderate): Secondary | ICD-10-CM | POA: Diagnosis not present

## 2019-01-31 DIAGNOSIS — M109 Gout, unspecified: Secondary | ICD-10-CM | POA: Diagnosis not present

## 2019-02-06 ENCOUNTER — Ambulatory Visit: Payer: PPO | Admitting: Emergency Medicine

## 2019-02-07 ENCOUNTER — Ambulatory Visit (INDEPENDENT_AMBULATORY_CARE_PROVIDER_SITE_OTHER): Payer: PPO | Admitting: Family Medicine

## 2019-02-07 ENCOUNTER — Ambulatory Visit: Payer: PPO | Admitting: Emergency Medicine

## 2019-02-07 ENCOUNTER — Other Ambulatory Visit: Payer: Self-pay

## 2019-02-07 VITALS — BP 152/75 | Ht 61.5 in | Wt 209.0 lb

## 2019-02-07 DIAGNOSIS — Z0000 Encounter for general adult medical examination without abnormal findings: Secondary | ICD-10-CM

## 2019-02-07 NOTE — Patient Instructions (Signed)
Thank you for taking time to come for your Medicare Wellness Visit. I appreciate your ongoing commitment to your health goals. Please review the following plan we discussed and let me know if I can assist you in the future.  Julie Greer LPN  Advance Directive  Advance directives are legal documents that let you make choices ahead of time about your health care and medical treatment in case you become unable to communicate for yourself. Advance directives are a way for you to communicate your wishes to family, friends, and health care providers. This can help convey your decisions about end-of-life care if you become unable to communicate. Discussing and writing advance directives should happen over time rather than all at once. Advance directives can be changed depending on your situation and what you want, even after you have signed the advance directives. If you do not have an advance directive, some states assign family decision makers to act on your behalf based on how closely you are related to them. Each state has its own laws regarding advance directives. You may want to check with your health care provider, attorney, or state representative about the laws in your state. There are different types of advance directives, such as:  Medical power of attorney.  Living will.  Do not resuscitate (DNR) or do not attempt resuscitation (DNAR) order. Health care proxy and medical power of attorney A health care proxy, also called a health care agent, is a person who is appointed to make medical decisions for you in cases in which you are unable to make the decisions yourself. Generally, people choose someone they know well and trust to represent their preferences. Make sure to ask this person for an agreement to act as your proxy. A proxy may have to exercise judgment in the event of a medical decision for which your wishes are not known. A medical power of attorney is a legal document that names your  health care proxy. Depending on the laws in your state, after the document is written, it may also need to be:  Signed.  Notarized.  Dated.  Copied.  Witnessed.  Incorporated into your medical record. You may also want to appoint someone to manage your financial affairs in a situation in which you are unable to do so. This is called a durable power of attorney for finances. It is a separate legal document from the durable power of attorney for health care. You may choose the same person or someone different from your health care proxy to act as your agent in financial matters. If you do not appoint a proxy, or if there is a concern that the proxy is not acting in your best interests, a court-appointed guardian may be designated to act on your behalf. Living will A living will is a set of instructions documenting your wishes about medical care when you cannot express them yourself. Health care providers should keep a copy of your living will in your medical record. You may want to give a copy to family members or friends. To alert caregivers in case of an emergency, you can place a card in your wallet to let them know that you have a living will and where they can find it. A living will is used if you become:  Terminally ill.  Incapacitated.  Unable to communicate or make decisions. Items to consider in your living will include:  The use or non-use of life-sustaining equipment, such as dialysis machines and breathing machines (ventilators).    A DNR or DNAR order, which is the instruction not to use cardiopulmonary resuscitation (CPR) if breathing or heartbeat stops.  The use or non-use of tube feeding.  Withholding of food and fluids.  Comfort (palliative) care when the goal becomes comfort rather than a cure.  Organ and tissue donation. A living will does not give instructions for distributing your money and property if you should pass away. It is recommended that you seek the  advice of a lawyer when writing a will. Decisions about taxes, beneficiaries, and asset distribution will be legally binding. This process can relieve your family and friends of any concerns surrounding disputes or questions that may come up about the distribution of your assets. DNR or DNAR A DNR or DNAR order is a request not to have CPR in the event that your heart stops beating or you stop breathing. If a DNR or DNAR order has not been made and shared, a health care provider will try to help any patient whose heart has stopped or who has stopped breathing. If you plan to have surgery, talk with your health care provider about how your DNR or DNAR order will be followed if problems occur. Summary  Advance directives are the legal documents that allow you to make choices ahead of time about your health care and medical treatment in case you become unable to communicate for yourself.  The process of discussing and writing advance directives should happen over time. You can change the advance directives, even after you have signed them.  Advance directives include DNR or DNAR orders, living wills, and designating an agent as your medical power of attorney. This information is not intended to replace advice given to you by your health care provider. Make sure you discuss any questions you have with your health care provider. Document Released: 12/28/2007 Document Revised: 08/09/2016 Document Reviewed: 08/09/2016 Elsevier Interactive Patient Education  2019 [employer].

## 2019-02-07 NOTE — Progress Notes (Addendum)
Presents today for [employer] Visit   Date of last exam: 11-28-2018  Interpreter used for this visit? No  Patient was seen in clinic  Patient Care Team: Patient, No Pcp Per as PCP - General (General Practice) Adrian Prows, MD as Consulting Physician (Cardiology) Rosita Fire, MD as Consulting Physician (Nephrology)   Other items to address today:  Discussed eye/dental yearly exam Discussed immunizations  Discussed diabetic documentation (bring to next visit)    Other Screening: Last screening for diabetes: 10-03-2017 Last lipid screening:08-04-2018   ADVANCE DIRECTIVES: Discussed: yes On File: no Materials Provided: yes  Immunization status:  Immunization History  Administered Date(s) Administered  . Influenza Inj Mdck Quad Pf 09/19/2017  . Influenza Split 07/26/2013  . Influenza, Seasonal, Injecte, Preservative Fre 09/21/2012  . Influenza,inj,Quad PF,6+ Mos 07/09/2018  . Influenza-Unspecified 07/09/2018  . Pneumococcal Conjugate-13 07/26/2013  . Td 10/12/2006  . Zoster 10/13/2007     Health Maintenance Due  Topic Date Due  . HEMOGLOBIN A1C  02/02/2019     Functional Status Survey: Is the patient deaf or have difficulty hearing?: No Does the patient have difficulty seeing, even when wearing glasses/contacts?: No Does the patient have difficulty concentrating, remembering, or making decisions?: No Does the patient have difficulty walking or climbing stairs?: No Does the patient have difficulty dressing or bathing?: No Does the patient have difficulty doing errands alone such as visiting a doctor's office or shopping?: No   6CIT Screen 02/07/2019  What Year? 0 points  What month? 0 points  What time? 0 points  Count back from 20 0 points  Months in reverse 0 points  Repeat phrase 0 points  Total Score 0        Clinical Support from 02/07/2019 in [facility]  AUDIT-C Score  0       Home Environment:  One  story by herself  Still works in billing for [employer]  No trouble climbing stairs Scattered rugs that are secured  Yes grab bars   Patient Active Problem List   Diagnosis Date Noted  . Acute renal failure superimposed on stage 3 chronic kidney disease ([place])   . SOB (shortness of breath)   . CKD (chronic kidney disease), stage III ([place]) 08/07/2018  . Symptomatic anemia 08/07/2018  . Abnormal mammogram of left breast 06/26/2014  . DDD (degenerative disc disease), lumbar 02/08/2013  . HTN, goal below 140/80 09/21/2012  . Limb pain 09/21/2012  . Hyperlipidemia 09/21/2012  . Diabetes ([place]) 09/21/2012     Past Medical History:  Diagnosis Date  . Arthritis   . Blood transfusion without reported diagnosis   . Cataract   . Diabetes mellitus without complication ([place])    control by diet only  . Hyperlipidemia   . Hypertension      Past Surgical History:  Procedure Laterality Date  . ABDOMINAL HYSTERECTOMY  1974   Class V PAP  . APPENDECTOMY    . BIOPSY  08/09/2018   Procedure: BIOPSY;  Surgeon: Ronnette Juniper, MD;  Location: [place];  Service: Gastroenterology;;  . COLONOSCOPY    . COLONOSCOPY WITH PROPOFOL N/A 08/09/2018   Procedure: COLONOSCOPY WITH PROPOFOL;  Surgeon: Ronnette Juniper, MD;  Location: [place];  Service: Gastroenterology;  Laterality: N/A;  . ESOPHAGOGASTRODUODENOSCOPY (EGD) WITH PROPOFOL N/A 08/09/2018   Procedure: ESOPHAGOGASTRODUODENOSCOPY (EGD) WITH PROPOFOL;  Surgeon: Ronnette Juniper, MD;  Location: [place];  Service: Gastroenterology;  Laterality: N/A;  . HOT HEMOSTASIS N/A 08/09/2018  Procedure: HOT HEMOSTASIS (ARGON PLASMA COAGULATION/BICAP);  Surgeon: Ronnette Juniper, MD;  Location: [place];  Service: Gastroenterology;  Laterality: N/A;  . SPINAL CORD DECOMPRESSION  03/28/2013   L5 S1   Dr Rolena Infante  . TONSILLECTOMY       Family History  Problem Relation Age of Onset  . Stroke Mother   . Diabetes Mother   . Heart disease Mother   .  Hyperlipidemia Mother   . Heart disease Father   . Cancer Sister        Ovarian and stomach cancer  . Diabetes Maternal Grandmother   . Heart disease Maternal Grandmother   . Cancer Maternal Grandfather   . Arthritis Paternal Grandmother      Social History   Socioeconomic History  . Marital status: Divorced    Spouse name: Not on file  . Number of children: 1  . Years of education: Not on file  . Highest education level: Not on file  Occupational History  . Occupation: Retired    Fish farm manager: [employer]  Social Needs  . Financial resource strain: Not on file  . Food insecurity:    Worry: Not on file    Inability: Not on file  . Transportation needs:    Medical: Not on file    Non-medical: Not on file  Tobacco Use  . Smoking status: Never Smoker  . Smokeless tobacco: Never Used  Substance and Sexual Activity  . Alcohol use: No  . Drug use: No  . Sexual activity: Not on file  Lifestyle  . Physical activity:    Days per week: Not on file    Minutes per session: Not on file  . Stress: Not on file  Relationships  . Social connections:    Talks on phone: Not on file    Gets together: Not on file    Attends religious service: Not on file    Active member of club or organization: Not on file    Attends meetings of clubs or organizations: Not on file    Relationship status: Not on file  . Intimate partner violence:    Fear of current or ex partner: Not on file    Emotionally abused: Not on file    Physically abused: Not on file    Forced sexual activity: Not on file  Other Topics Concern  . Not on file  Social History Narrative   Divorced. Education: [employer]. Exercise: No.     Allergies  Allergen Reactions  . Penicillins Hives    Childhood reaction Has patient had a PCN reaction causing immediate rash, facial/tongue/throat swelling, SOB or lightheadedness with hypotension: Yes Has patient had a PCN reaction causing severe rash involving mucus membranes or  skin necrosis: No Has patient had a PCN reaction that required hospitalization: No Has patient had a PCN reaction occurring within the last 10 years: No If all of the above answers are "NO", then may proceed with Cephalosporin use.     Prior to Admission medications   Medication Sig Start Date End Date Taking? Authorizing Provider  allopurinol (ZYLOPRIM) 100 MG tablet Take 100 mg by mouth daily before supper.  02/13/18  Yes [provider]  atorvastatin (LIPITOR) 40 MG tablet Take 1 tablet (40 mg total) by mouth daily. Patient taking differently: Take 40 mg by mouth.  08/04/18  Yes Timmothy Euler, [place] D, PA-C  blood glucose meter kit and supplies KIT Check fasting blood sugar once daily. Use diagnosis code of E11.9. 03/08/18  Yes  [name], PA-C  furosemide (LASIX) 40 MG tablet Take 40 mg by mouth daily.   Yes [provider]  lisinopril (PRINIVIL,ZESTRIL) 20 MG tablet Take 1 tablet (20 mg total) by mouth daily. Patient taking differently: Take 20 mg by mouth daily before supper.  08/04/18  Yes Tenna Delaine D, PA-C  Metoprolol Succinate 50 MG CS24 Take 1 tablet by mouth daily. Patient taking differently: Take 50 mg by mouth daily before breakfast.  08/04/18  Yes Timmothy Euler, [place] D, PA-C  [facility] DELICA LANCETS FINE MISC USE AS DIRECTED 09/01/18  Yes Sagardia, Ines Bloomer, MD  [facility] VERIO test strip USE ONCE DAILY *E11.9* 01/01/19  Yes Rutherford Guys, MD  sitaGLIPtin (JANUVIA) 50 MG tablet Take 1 tablet (50 mg total) by mouth daily. 11/28/18  Yes Rutherford Guys, MD     Depression screen [facility] 2/9 02/07/2019 11/28/2018 08/15/2018 08/04/2018 03/17/2018  Decreased Interest 0 0 0 0 0  Down, Depressed, Hopeless 0 0 0 0 0  PHQ - 2 Score 0 0 0 0 0     Fall Risk  02/07/2019 11/28/2018 08/15/2018 08/04/2018 03/17/2018  Falls in the past year? 0 0 1 1 No  Number falls in past yr: 0 0 1 - -  Injury with Fall? 0 0 1 - -  Comment - - knees - -      PHYSICAL EXAM: BP (!) 152/75    Ht 5' 1.5" (1.562 m)   Wt 209 lb (94.8 kg)   BMI 38.85 kg/m    Wt Readings from Last 3 Encounters:  02/07/19 209 lb (94.8 kg)  11/28/18 201 lb (91.2 kg)  08/15/18 203 lb 3.2 oz (92.2 kg)     No exam data present    Physical Exam   Education/Counseling provided regarding diet and exercise, prevention of chronic diseases, smoking/tobacco cessation, if applicable, and reviewed "Covered Medicare Preventive Services."   ASSESSMENT/PLAN: 1. Medicare annual wellness visit, subsequent

## 2019-02-20 ENCOUNTER — Other Ambulatory Visit: Payer: Self-pay | Admitting: Family Medicine

## 2019-02-22 ENCOUNTER — Encounter: Payer: Self-pay | Admitting: Cardiology

## 2019-02-22 ENCOUNTER — Other Ambulatory Visit: Payer: Self-pay

## 2019-02-22 ENCOUNTER — Ambulatory Visit: Payer: PPO | Admitting: Cardiology

## 2019-02-22 VITALS — BP 155/81 | HR 78 | Ht 62.0 in | Wt 206.0 lb

## 2019-02-22 DIAGNOSIS — I10 Essential (primary) hypertension: Secondary | ICD-10-CM

## 2019-02-22 DIAGNOSIS — R0609 Other forms of dyspnea: Secondary | ICD-10-CM

## 2019-02-22 DIAGNOSIS — E782 Mixed hyperlipidemia: Secondary | ICD-10-CM

## 2019-02-22 NOTE — Progress Notes (Signed)
Virtual Visit via Video Note: This visit type was conducted due to national recommendations for restrictions regarding the COVID-19 Pandemic (e.g. social distancing).  This format is felt to be most appropriate for this patient at this time.  All issues noted in this document were discussed and addressed.  No physical exam was performed (except for noted visual exam findings with Telehealth visits).  The patient has consented to conduct a Telehealth visit and understands insurance will be billed.   I connected with@, on 02/22/19 at  by a video enabled telemedicine application and verified that I am speaking with the correct person using two identifiers.   I discussed the limitations of evaluation and management by telemedicine and the availability of in person appointments. The patient expressed understanding and agreed to proceed.   I have discussed with patient regarding the safety during COVID Pandemic and steps and precautions to be taken including social distancing, frequent hand wash and use of detergent soap, gels with the patient. I asked the patient to avoid touching mouth, nose, eyes, ears with the hands. I encouraged regular walking around the neighborhood and exercise and regular diet, as long as social distancing can be maintained.  Primary Physician/Referring:  Rutherford Guys, MD  Patient ID: Patrice Paradise, female    DOB: 1945-12-11, 73 y.o.   MRN: [id number]  Chief Complaint  Patient presents with  . Hypertension  . Shortness of Breath  . Follow-up    59yr    HPI: ADALIND WEITZ  is a 73 y.o. female  with arthritis, diabetes, hyperlipidemia now on Lipitor, and resistant hypertension without renal artery stenosis by renal duplex 11/09/2017. Underwent echocardiogram on 11/09/2017 showing moderate LVH and diastolic dysfunction otherwise normal. Exercise Myoview stress test on 12/02/2017 was considered low risk without evidence of ischemia, but did reveal lower than normal exercise  capacity.  She now presents for 12 month follow up of hypertension and dyspnea, since discontinuing amlodipine leg edema is resolved.  She still has mild chronic dyspnea but states no PND orthopnea.  She has not had any palpitations or chest pain.  Admitted sometime in November 2019 with GI bleed which is resolved.  Was found to have some colonic ulceration and polyps.  Otherwise feels well and since correcting her anemia, states that her energy level is also improved.    Past Medical History:  Diagnosis Date  . Arthritis   . Blood transfusion without reported diagnosis   . Cataract   . Diabetes mellitus without complication ([place])    control by diet only  . Hyperlipidemia   . Hypertension     Past Surgical History:  Procedure Laterality Date  . ABDOMINAL HYSTERECTOMY  1974   Class V PAP  . APPENDECTOMY    . BIOPSY  08/09/2018   Procedure: BIOPSY;  Surgeon: Ronnette Juniper, MD;  Location: [place];  Service: Gastroenterology;;  . COLONOSCOPY    . COLONOSCOPY WITH PROPOFOL N/A 08/09/2018   Procedure: COLONOSCOPY WITH PROPOFOL;  Surgeon: Ronnette Juniper, MD;  Location: [place];  Service: Gastroenterology;  Laterality: N/A;  . ESOPHAGOGASTRODUODENOSCOPY (EGD) WITH PROPOFOL N/A 08/09/2018   Procedure: ESOPHAGOGASTRODUODENOSCOPY (EGD) WITH PROPOFOL;  Surgeon: Ronnette Juniper, MD;  Location: [place];  Service: Gastroenterology;  Laterality: N/A;  . HOT HEMOSTASIS N/A 08/09/2018   Procedure: HOT HEMOSTASIS (ARGON PLASMA COAGULATION/BICAP);  Surgeon: Ronnette Juniper, MD;  Location: [place];  Service: Gastroenterology;  Laterality: N/A;  . SPINAL CORD DECOMPRESSION  03/28/2013   L5 S1  Dr Rolena Infante  . TONSILLECTOMY      Social History   Socioeconomic History  . Marital status: Divorced    Spouse name: Not on file  . Number of children: 1  . Years of education: Not on file  . Highest education level: Not on file  Occupational History  . Occupation: Retired    Fish farm manager: [employer]   Social Needs  . Financial resource strain: Not on file  . Food insecurity:    Worry: Not on file    Inability: Not on file  . Transportation needs:    Medical: Not on file    Non-medical: Not on file  Tobacco Use  . Smoking status: Never Smoker  . Smokeless tobacco: Never Used  Substance and Sexual Activity  . Alcohol use: No  . Drug use: No  . Sexual activity: Not on file  Lifestyle  . Physical activity:    Days per week: Not on file    Minutes per session: Not on file  . Stress: Not on file  Relationships  . Social connections:    Talks on phone: Not on file    Gets together: Not on file    Attends religious service: Not on file    Active member of club or organization: Not on file    Attends meetings of clubs or organizations: Not on file    Relationship status: Not on file  . Intimate partner violence:    Fear of current or ex partner: Not on file    Emotionally abused: Not on file    Physically abused: Not on file    Forced sexual activity: Not on file  Other Topics Concern  . Not on file  Social History Narrative   Divorced. Education: [employer]. Exercise: No.   Review of Systems  Constitution: Negative for chills, decreased appetite, malaise/fatigue and weight gain.  Cardiovascular: Positive for dyspnea on exertion (stable). Negative for leg swelling and syncope.  Endocrine: Negative for cold intolerance.  Hematologic/Lymphatic: Does not bruise/bleed easily.  Musculoskeletal: Negative for joint swelling.  Gastrointestinal: Negative for abdominal pain, anorexia, change in bowel habit, hematochezia and melena.  Neurological: Negative for headaches and light-headedness.  Psychiatric/Behavioral: Negative for depression and substance abuse.  All other systems reviewed and are negative.     Objective  Blood pressure (!) 155/81, pulse 78, height 5\' 2"  (1.575 m), weight 206 lb (93.4 kg). Body mass index is 37.68 kg/m.  Physical exam not performed or limited due to  virtual visit.  Patient appeared to be in no distress, Neck was supple, respiration was not labored.  Please see exam details from prior visit is as below.    Physical Exam  Constitutional: She appears well-developed. No distress.  Morbidly obese  HENT:  Head: Atraumatic.  Eyes: Conjunctivae are normal.  Neck: Neck supple. No thyromegaly present.  Short neck and difficult to evaluate JVP  Cardiovascular: Normal rate, regular rhythm and normal heart sounds. Exam reveals no gallop.  No murmur heard. Pulses:      Carotid pulses are 2+ on the right side and 2+ on the left side with bruit.      Dorsalis pedis pulses are 2+ on the right side and 2+ on the left side.       Posterior tibial pulses are 2+ on the right side and 2+ on the left side.  Femoral and popliteal pulse difficult to feel due to patient's body habitus.   Pulmonary/Chest: Effort normal and breath sounds normal.  Abdominal: Soft. Bowel sounds are normal.  Obese.   Musculoskeletal: Normal range of motion.        General: No edema.  Neurological: She is alert.  Skin: Skin is warm and dry.  Psychiatric: She has a normal mood and affect.   Radiology: No results found.  Laboratory examination:    CMP Latest Ref Rng & Units 11/28/2018 08/15/2018 08/09/2018  Glucose 65 - 99 mg/dL 164(H) 95 98  BUN 8 - 27 mg/dL 26 20 18   Creatinine 0.57 - 1.00 mg/dL 1.07(H) 1.11(H) 1.06(H)  Sodium 134 - 144 mmol/L 139 145(H) 142  Potassium 3.5 - 5.2 mmol/L 4.5 4.6 4.2  Chloride 96 - 106 mmol/L 102 110(H) 113(H)  CO2 20 - 29 mmol/L 22 21 24   Calcium 8.7 - 10.3 mg/dL 9.4 8.9 9.0  Total Protein 6.0 - 8.5 g/dL 6.2 - -  Total Bilirubin 0.0 - 1.2 mg/dL 0.5 - -  Alkaline Phos 39 - 117 IU/L 169(H) - -  AST 0 - 40 IU/L 12 - -  ALT 0 - 32 IU/L 12 - -   CBC Latest Ref Rng & Units 11/28/2018 08/15/2018 08/09/2018  WBC 3.4 - 10.8 x10E3/uL 5.4 5.2 5.6  Hemoglobin 11.1 - 15.9 g/dL 12.1 9.1(A) 8.0(L)  Hematocrit 34.0 - 46.6 % 36.0 28.8(A) 27.9(L)   Platelets 150 - 450 x10E3/uL 321 - 341   Lipid Panel     Component Value Date/Time   CHOL 154 08/04/2018 1710   TRIG 181 (H) 08/04/2018 1710   HDL 43 08/04/2018 1710   CHOLHDL 3.6 08/04/2018 1710   CHOLHDL 3.3 05/29/2014 1011   VLDL 26 05/29/2014 1011   LDLCALC 75 08/04/2018 1710   HEMOGLOBIN A1C Lab Results  Component Value Date   HGBA1C 6.6 (A) 08/04/2018   MPG 177 (H) 01/01/2015   TSH No results for input(s): TSH in the last 8760 hours.  PRN Meds:. There are no discontinued medications. Current Meds  Medication Sig  . allopurinol (ZYLOPRIM) 100 MG tablet Take 100 mg by mouth daily before supper.   Marland Kitchen atorvastatin (LIPITOR) 40 MG tablet Take 1 tablet (40 mg total) by mouth daily. (Patient taking differently: Take 40 mg by mouth. )  . furosemide (LASIX) 40 MG tablet Take 40 mg by mouth daily.  Marland Kitchen JANUVIA 50 MG tablet TAKE 1 TABLET BY MOUTH EVERY DAY  . lisinopril (PRINIVIL,ZESTRIL) 20 MG tablet Take 1 tablet (20 mg total) by mouth daily. (Patient taking differently: Take 20 mg by mouth daily before supper. )  . Metoprolol Succinate 50 MG CS24 Take 1 tablet by mouth daily. (Patient taking differently: Take 50 mg by mouth daily before breakfast. )   Cardiac Studies:   Echocardiogram 08/08/2018 :   Left ventricle: The cavity size was normal. Wall thickness was   normal. Systolic function was normal. The estimated ejection   fraction was in the range of 60% to 65%. Wall motion was normal;   Features are   consistent with a pseudonormal left ventricular filling pattern,   with concomitant abnormal relaxation and increased filling   pressure (grade 2 diastolic dysfunction). - Aortic valve: There was trivial regurgitation. - Pulmonary arteries: Systolic pressure was mildly increased. PA   peak pressure: 35 mm Hg (S).  Exercise myoview stress 12/02/2017: 1. The patient performed treadmill exercise using a Bruce protocol, completing 4:22 minutes. The patient completed an estimated  workload of 6.26 METS, reaching 88% of the maximum predicted heart rate. Normal hemodynamic response, Lower than normal exercise  capacity. Stress symptoms included dyspnea. 1 mm upsloping and horizontal ST depressions inferior, inferolateral leads that persist 2 min into recovery. These changes are equivocal for ischemia. 2. The overall quality of the study is excellent. There is no evidence of abnormal lung activity. Stress and rest SPECT images demonstrate homogeneous tracer distribution throughout the myocardium. Gated SPECT imaging reveals normal myocardial thickening and wall motion. The left ventricular ejection fraction was normal (69%). 3. Low risk study.  Carotid artery duplex 11/09/2017: Minimal heterogenous plaque in the left internal carotid artery (1-15%). Antegrade right vertebral artery flow. Antegrade left vertebral artery flow. Future studies if clinically indicated.  Assessment   Dyspnea on exertion  Essential hypertension  Mixed hyperlipidemia  EKG 10/20/2017: Sinus rhythm 90 bpm. Normal axis. Normal conduction. Low voltage precordial leads  Recommendations:   I have reviewed the results of the recently performed echocardiogram and also stress test that was done last year.  She has grade 2 diastolic dysfunction and obesity and hypertension contributing to her dyspnea.  There is no clinical evidence of congestive heart failure as I can tell, no PND, no leg edema.  Since stopping amlodipine leg edema has completely subsided.  I also reviewed her lipids, excellent control except for elevated triglycerides, probably related to her diet.  We discussed regarding weight loss.  She has had recent GI bleed and was in the hospital with colonic bleed, CBC has returned back to normal.  Otherwise she remains well, she has not had any chest pain or palpitations.  In view of the fact that from cardiac standpoint she remained stable, although she has carotid bruit carotid duplex x2 has  revealed only minimal plaque.  With regard to hypertension, would consider switching from lisinopril to an ARB as the blood pressure has not been well controlled.  We could also consider increasing the dose of the metoprolol or could consider changing to labetalol 200 mg p.o. twice daily.  She is seeing Dr. Pamella Pert next week regarding this and also management of her diabetes.  From cardiac standpoint stable, I will see her back on a as needed basis.  Adrian Prows, MD, [facility] 02/22/2019, 4:17 PM [facility] [place] Pager: [phone] Office: [phone] If no answer Cell [phone]

## 2019-02-28 ENCOUNTER — Other Ambulatory Visit: Payer: Self-pay

## 2019-02-28 ENCOUNTER — Telehealth (INDEPENDENT_AMBULATORY_CARE_PROVIDER_SITE_OTHER): Payer: PPO | Admitting: Family Medicine

## 2019-02-28 DIAGNOSIS — E119 Type 2 diabetes mellitus without complications: Secondary | ICD-10-CM

## 2019-02-28 DIAGNOSIS — N183 Chronic kidney disease, stage 3 unspecified: Secondary | ICD-10-CM

## 2019-02-28 DIAGNOSIS — I10 Essential (primary) hypertension: Secondary | ICD-10-CM | POA: Diagnosis not present

## 2019-02-28 MED ORDER — DAPAGLIFLOZIN PROPANEDIOL 10 MG PO TABS: 10.0000 mg | ORAL_TABLET | Freq: Every day | ORAL | 5 refills | Status: DC

## 2019-02-28 NOTE — Progress Notes (Signed)
Pt is concerned about the the januvia medication. She is gaining weight ans the medication is causing her cbg's to be very high. The reading from this morning was 174 ac and has been reading abnormal since November start of med. The medication is also causing nausea 2 hours after she takes it. Taking bp daily as well, this morning was 141/83. She says she will upload picture of bp and cbg's for the past 10 days

## 2019-02-28 NOTE — Progress Notes (Signed)
Virtual Visit Note  I connected with a patient on 02/28/19 at 444pm by phone and verified that I am speaking with the correct person using two identifiers. Erica Thomas is currently located at home and patient is currently with them during visit. The provider, Rutherford Guys, MD is located in their office at time of visit.  I discussed the limitations, risks, security and privacy concerns of performing an evaluation and management service by telephone and the availability of in person appointments. I also discussed with the patient that there may be a patient responsible charge related to this service. The patient expressed understanding and agreed to proceed.   CC: DM2 and HTN  HPI ? Patient is a 73 y.o. female with past medical history significant for DM2, HTN, CKD3, anemia from ulcer and bleeding AVM who presents today for routine followup  Last OV feb 2020 Started januvia for avg cbg 156 Saw cards may 2020 - stable, no changes, does provide recommendations for improved BP control if needed  At beginning of taking januvia was getting nauseous better now with food cbgs however have been running higher Checks fasting and 2PP dinner, qod Lowest 156, highest 193  Has seen nephrology, did not make any changes crt in April per renal note was 1.09 Has been checking BP at home 141/83 - this morning 136/80 143/80 149/80 124/73 120/70 124/74  Lab Results  Component Value Date   HGBA1C 6.6 (A) 08/04/2018   HGBA1C 7.4 01/27/2018   HGBA1C 6.4 (H) 10/03/2017   Lab Results  Component Value Date   LDLCALC 75 08/04/2018   CREATININE 1.07 (H) 11/28/2018  GFR 51   Allergies  Allergen Reactions  . Amlodipine Swelling  . Penicillins Hives    Childhood reaction Has patient had a PCN reaction causing immediate rash, facial/tongue/throat swelling, SOB or lightheadedness with hypotension: Yes Has patient had a PCN reaction causing severe rash involving mucus membranes or skin  necrosis: No Has patient had a PCN reaction that required hospitalization: No Has patient had a PCN reaction occurring within the last 10 years: No If all of the above answers are "NO", then may proceed with Cephalosporin use.    Prior to Admission medications   Medication Sig Start Date End Date Taking? Authorizing Provider  allopurinol (ZYLOPRIM) 100 MG tablet Take 100 mg by mouth daily before supper.  02/13/18   [provider]  atorvastatin (LIPITOR) 40 MG tablet Take 1 tablet (40 mg total) by mouth daily. Patient taking differently: Take 40 mg by mouth.  08/04/18   Tenna Delaine D, PA-C  blood glucose meter kit and supplies KIT Check fasting blood sugar once daily. Use diagnosis code of E11.9. 03/08/18   [name], PA-C  furosemide (LASIX) 40 MG tablet Take 40 mg by mouth daily.    [provider]  JANUVIA 50 MG tablet TAKE 1 TABLET BY MOUTH EVERY DAY 02/20/19   Rutherford Guys, MD  lisinopril (PRINIVIL,ZESTRIL) 20 MG tablet Take 1 tablet (20 mg total) by mouth daily. Patient taking differently: Take 20 mg by mouth daily before supper.  08/04/18   Tenna Delaine D, PA-C  Metoprolol Succinate 50 MG CS24 Take 1 tablet by mouth daily. Patient taking differently: Take 50 mg by mouth daily before breakfast.  08/04/18   Tenna Delaine D, PA-C  [facility] DELICA LANCETS FINE MISC USE AS DIRECTED 09/01/18   Horald Pollen, MD  [facility] VERIO test strip USE ONCE DAILY *E11.9* 01/01/19   Pamella Pert,  Lilia Argue, MD    Past Medical History:  Diagnosis Date  . Arthritis   . Blood transfusion without reported diagnosis   . Cataract   . Diabetes mellitus without complication ([place])    control by diet only  . Hyperlipidemia   . Hypertension     Past Surgical History:  Procedure Laterality Date  . ABDOMINAL HYSTERECTOMY  1974   Class V PAP  . APPENDECTOMY    . BIOPSY  08/09/2018   Procedure: BIOPSY;  Surgeon: Ronnette Juniper, MD;  Location: [place];  Service:  Gastroenterology;;  . COLONOSCOPY    . COLONOSCOPY WITH PROPOFOL N/A 08/09/2018   Procedure: COLONOSCOPY WITH PROPOFOL;  Surgeon: Ronnette Juniper, MD;  Location: [place];  Service: Gastroenterology;  Laterality: N/A;  . ESOPHAGOGASTRODUODENOSCOPY (EGD) WITH PROPOFOL N/A 08/09/2018   Procedure: ESOPHAGOGASTRODUODENOSCOPY (EGD) WITH PROPOFOL;  Surgeon: Ronnette Juniper, MD;  Location: [place];  Service: Gastroenterology;  Laterality: N/A;  . HOT HEMOSTASIS N/A 08/09/2018   Procedure: HOT HEMOSTASIS (ARGON PLASMA COAGULATION/BICAP);  Surgeon: Ronnette Juniper, MD;  Location: [facility];  Service: Gastroenterology;  Laterality: N/A;  . SPINAL CORD DECOMPRESSION  03/28/2013   L5 S1   Dr Rolena Infante  . TONSILLECTOMY      Social History   Tobacco Use  . Smoking status: Never Smoker  . Smokeless tobacco: Never Used  Substance Use Topics  . Alcohol use: No    Family History  Problem Relation Age of Onset  . Stroke Mother   . Diabetes Mother   . Heart disease Mother   . Hyperlipidemia Mother   . Heart disease Father   . Cancer Sister        Ovarian and stomach cancer  . Diabetes Maternal Grandmother   . Heart disease Maternal Grandmother   . Cancer Maternal Grandfather   . Arthritis Paternal Grandmother     Review of Systems  Constitutional: Negative for chills and fever.  Respiratory: Negative for cough and shortness of breath.   Cardiovascular: Negative for chest pain, palpitations and leg swelling.  Gastrointestinal: Negative for abdominal pain, nausea and vomiting.    Objective  Vitals as reported by the patient: per above   ASSESSMENT and PLAN  1. Type 2 diabetes mellitus without complication, without long-term current use of insulin (HCC) Not controlled. Changing DDP4 to SGLT2, GFR 51. Reviewed r/se/b. Should also help with BP which is above goal. - Hemoglobin A1c; Future - Lipid panel; Future - TSH; Future  2. Essential hypertension See #1 - Lipid panel; Future  3. CKD  (chronic kidney disease), stage III (HCC) - Comprehensive metabolic panel; Future Stable, improved. Followed by renal  Other orders - dapagliflozin propanediol (FARXIGA) 10 MG TABS tablet; Take 10 mg by mouth daily.  FOLLOW-UP: 3 months   The above assessment and management plan was discussed with the patient. The patient verbalized understanding of and has agreed to the management plan. Patient is aware to call the clinic if symptoms persist or worsen. Patient is aware when to return to the clinic for a follow-up visit. Patient educated on when it is appropriate to go to the emergency department.    I provided 15 minutes of non-face-to-face time during this encounter.  Rutherford Guys, MD Primary Care at [place] [place], [place] [address] Ph.  [phone] Fax [phone]

## 2019-03-06 ENCOUNTER — Telehealth: Payer: Self-pay | Admitting: Family Medicine

## 2019-03-06 NOTE — Telephone Encounter (Signed)
Is there a cheaper medication this pt can take she states the [place] is to much for her to pay. Please advise

## 2019-03-06 NOTE — Telephone Encounter (Unsigned)
Copied from [place] [id number]. Topic: Quick Communication - Rx Refill/Question >> Mar 06, 2019  2:29 PM Celene Kras A wrote: Medication: dapagliflozin propanediol (FARXIGA) 10 MG TABS tablet  Has the patient contacted their pharmacy? Yes.  Pt called stating this medication would cost her $587. Pt is requesting an alternative medication please advise.  (Agent: If no, request that the patient contact the pharmacy for the refill.) (Agent: If yes, when and what did the pharmacy advise?)  Preferred Pharmacy (with phone number or street name): [id number] - [place], [place] AT [address] [place] [address] Phone: [phone] Fax: [phone] Not a 24 hour pharmacy; exact hours not known.    Agent: Please be advised that RX refills may take up to 3 business days. We ask that you follow-up with your pharmacy.

## 2019-03-07 MED ORDER — GLIPIZIDE 5 MG PO TABS: 5.0000 mg | ORAL_TABLET | Freq: Two times a day (BID) | ORAL | 3 refills | Status: DC

## 2019-03-07 NOTE — Telephone Encounter (Signed)
Please let her know that I sent in a rx for glipizide 5mg  to be taken twice a day with breakfast and dinner. thanks

## 2019-03-08 NOTE — Telephone Encounter (Signed)
Called pt to relay provider message. There was no answer so I left a message to call back.

## 2019-03-12 DIAGNOSIS — H21233 Degeneration of iris (pigmentary), bilateral: Secondary | ICD-10-CM | POA: Diagnosis not present

## 2019-03-12 DIAGNOSIS — E119 Type 2 diabetes mellitus without complications: Secondary | ICD-10-CM | POA: Diagnosis not present

## 2019-03-12 DIAGNOSIS — H2513 Age-related nuclear cataract, bilateral: Secondary | ICD-10-CM | POA: Diagnosis not present

## 2019-03-12 DIAGNOSIS — H02831 Dermatochalasis of right upper eyelid: Secondary | ICD-10-CM | POA: Diagnosis not present

## 2019-03-13 ENCOUNTER — Other Ambulatory Visit: Payer: Self-pay

## 2019-03-13 ENCOUNTER — Ambulatory Visit (INDEPENDENT_AMBULATORY_CARE_PROVIDER_SITE_OTHER): Payer: PPO | Admitting: Family Medicine

## 2019-03-13 DIAGNOSIS — I10 Essential (primary) hypertension: Secondary | ICD-10-CM | POA: Diagnosis not present

## 2019-03-13 DIAGNOSIS — N183 Chronic kidney disease, stage 3 unspecified: Secondary | ICD-10-CM

## 2019-03-13 DIAGNOSIS — E119 Type 2 diabetes mellitus without complications: Secondary | ICD-10-CM

## 2019-03-14 LAB — COMPREHENSIVE METABOLIC PANEL
ALT: 14 IU/L (ref 0–32)
AST: 15 IU/L (ref 0–40)
Albumin/Globulin Ratio: 1.9 (ref 1.2–2.2)
Albumin: 4 g/dL (ref 3.7–4.7)
Alkaline Phosphatase: 140 IU/L — ABNORMAL HIGH (ref 39–117)
BUN/Creatinine Ratio: 22 (ref 12–28)
BUN: 24 mg/dL (ref 8–27)
Bilirubin Total: 0.5 mg/dL (ref 0.0–1.2)
CO2: 21 mmol/L (ref 20–29)
Calcium: 9.7 mg/dL (ref 8.7–10.3)
Chloride: 103 mmol/L (ref 96–106)
Creatinine, Ser: 1.07 mg/dL — ABNORMAL HIGH (ref 0.57–1.00)
GFR calc Af Amer: 59 mL/min/{1.73_m2} — ABNORMAL LOW (ref >59)
GFR calc non Af Amer: 51 mL/min/{1.73_m2} — ABNORMAL LOW (ref >59)
Globulin, Total: 2.1 g/dL (ref 1.5–4.5)
Glucose: 135 mg/dL — ABNORMAL HIGH (ref 65–99)
Potassium: 4.3 mmol/L (ref 3.5–5.2)
Sodium: 139 mmol/L (ref 134–144)
Total Protein: 6.1 g/dL (ref 6.0–8.5)

## 2019-03-14 LAB — LIPID PANEL
Chol/HDL Ratio: 3.1 ratio (ref 0.0–4.4)
Cholesterol, Total: 172 mg/dL (ref 100–199)
HDL: 55 mg/dL (ref >39)
LDL Calculated: 96 mg/dL (ref 0–99)
Triglycerides: 106 mg/dL (ref 0–149)
VLDL Cholesterol Cal: 21 mg/dL (ref 5–40)

## 2019-03-14 LAB — HEMOGLOBIN A1C
Est. average glucose Bld gHb Est-mCnc: 192 mg/dL
Hgb A1c MFr Bld: 8.3 % — ABNORMAL HIGH (ref 4.8–5.6)

## 2019-03-14 LAB — TSH: TSH: 2.31 u[IU]/mL (ref 0.450–4.500)

## 2019-03-16 ENCOUNTER — Encounter: Payer: Self-pay | Admitting: Family Medicine

## 2019-03-16 ENCOUNTER — Other Ambulatory Visit: Payer: Self-pay | Admitting: Family Medicine

## 2019-03-16 MED ORDER — METFORMIN HCL ER 750 MG PO TB24: 750.0000 mg | ORAL_TABLET | Freq: Every day | ORAL | 1 refills | Status: DC

## 2019-03-19 ENCOUNTER — Telehealth: Payer: Self-pay | Admitting: Family Medicine

## 2019-03-19 NOTE — Telephone Encounter (Signed)
She is to take her metformin XR 750mg  every day with breakfast Thanks

## 2019-03-19 NOTE — Telephone Encounter (Signed)
Patient's concern/request has been addressed 

## 2019-03-19 NOTE — Telephone Encounter (Signed)
Spoke with pt and she states since she started the metformin her blood sugar has been rally low. She states it has been running in the 50s-68 range and she is starting to feel weak. She is taking the metformin and the glipizide before meals. Is there anything different you want me tell the pt about her medication?

## 2019-03-19 NOTE — Telephone Encounter (Signed)
Copied from [place] [id number]. Topic: Quick Communication - See Telephone Encounter >> Mar 19, 2019  9:59 AM Blase Mess A wrote: CRM for notification. See Telephone encounter for: 03/19/19.  Patient is calling regarding her metFORMIN (GLUCOPHAGE-XR) 750 MG 24 hr tablet [[id number]] she is very confused about her medication dosage. Please advise CB- [phone]

## 2019-03-19 NOTE — Telephone Encounter (Signed)
Copied from [place] [id number]. Topic: Quick Communication - See Telephone Encounter >> Mar 19, 2019  9:59 AM Blase Mess A wrote: CRM for notification. See Telephone encounter for: 03/19/19.  Patient is calling regarding her metFORMIN (GLUCOPHAGE-XR) 750 MG 24 hr tablet [[id number]] she is very confused about her medication dosage. Please advise CB- [phone]

## 2019-03-19 NOTE — Telephone Encounter (Signed)
Copied from [place] [id number]. Topic: Quick Communication - See Telephone Encounter >> Mar 19, 2019  9:59 AM Blase Mess A wrote: CRM for notification. See Telephone encounter for: 03/19/19.  Patient is calling regarding her metFORMIN (GLUCOPHAGE-XR) 750 MG 24 hr tablet [[id number]] she is very confused about her medication dosage. Please advise CB- [phone]

## 2019-03-20 NOTE — Telephone Encounter (Signed)
Please let patient know to take 1/2 tab of metformin instead of a full tab. If she continued to have low sugars, ie < 80. Then stop metformin. thanks

## 2019-03-21 NOTE — Telephone Encounter (Signed)
Spoke with patient Was taking glipizide 5mg  BID Then I added metformin XR 750mg  once a day Took both meds and had hypoglycemia down into the 50s, took glucose tabs Yesterday and today just took metformin, stopped glipizide, and cbgs 90-100s, no more lows Has cut back significantly on carbohydrates, has lost weight GFR 51 Last A1c 8.3  Had similar reaction of hypoglycemia with glimepiride.

## 2019-03-21 NOTE — Telephone Encounter (Signed)
Pt says she stopped the glipizide for the past few days waiting to hear bk from [place]. Cbg 94 am 96 pm and at bedtime 106. She wants to know does she stay off of the glipizide as well? She understood suggestion with how to continue to take the metformin

## 2019-03-21 NOTE — Addendum Note (Signed)
Addended by: Rutherford Guys on: 03/21/2019 06:16 PM   Modules accepted: Orders

## 2019-03-26 ENCOUNTER — Other Ambulatory Visit: Payer: Self-pay | Admitting: Family Medicine

## 2019-04-04 ENCOUNTER — Other Ambulatory Visit: Payer: Self-pay | Admitting: Physician Assistant

## 2019-04-04 DIAGNOSIS — I10 Essential (primary) hypertension: Secondary | ICD-10-CM

## 2019-04-13 ENCOUNTER — Other Ambulatory Visit: Payer: Self-pay | Admitting: Physician Assistant

## 2019-04-13 DIAGNOSIS — I10 Essential (primary) hypertension: Secondary | ICD-10-CM

## 2019-04-13 NOTE — Telephone Encounter (Signed)
Forwarding medication refill to PCP for review. 

## 2019-05-31 ENCOUNTER — Ambulatory Visit (INDEPENDENT_AMBULATORY_CARE_PROVIDER_SITE_OTHER): Payer: PPO | Admitting: Family Medicine

## 2019-05-31 ENCOUNTER — Other Ambulatory Visit: Payer: Self-pay

## 2019-05-31 ENCOUNTER — Encounter: Payer: Self-pay | Admitting: Family Medicine

## 2019-05-31 VITALS — BP 132/80 | Temp 80.0°F | Ht 62.0 in | Wt 189.4 lb

## 2019-05-31 DIAGNOSIS — N183 Chronic kidney disease, stage 3 (moderate): Secondary | ICD-10-CM | POA: Diagnosis not present

## 2019-05-31 DIAGNOSIS — Z23 Encounter for immunization: Secondary | ICD-10-CM | POA: Diagnosis not present

## 2019-05-31 DIAGNOSIS — I10 Essential (primary) hypertension: Secondary | ICD-10-CM

## 2019-05-31 DIAGNOSIS — E1122 Type 2 diabetes mellitus with diabetic chronic kidney disease: Secondary | ICD-10-CM | POA: Diagnosis not present

## 2019-05-31 DIAGNOSIS — E785 Hyperlipidemia, unspecified: Secondary | ICD-10-CM | POA: Diagnosis not present

## 2019-05-31 NOTE — Patient Instructions (Signed)
° ° ° °  If you have lab work done today you will be contacted with your lab results within the next 2 weeks.  If you have not heard from us then please contact us. The fastest way to get your results is to register for My Chart. ° ° °IF you received an x-ray today, you will receive an invoice from [facility]. Please contact [facility] at [phone] with questions or concerns regarding your invoice.  ° °IF you received labwork today, you will receive an invoice from [facility]. Please contact [facility] at [phone] with questions or concerns regarding your invoice.  ° °Our billing staff will not be able to assist you with questions regarding bills from these companies. ° °You will be contacted with the lab results as soon as they are available. The fastest way to get your results is to activate your My Chart account. Instructions are located on the last page of this paperwork. If you have not heard from us regarding the results in 2 weeks, please contact this office. °  ° ° ° °

## 2019-05-31 NOTE — Progress Notes (Signed)
8/27/20204:13 PM  Erica Thomas 08-24-1946, 73 y.o., female [id number]  Chief Complaint  Patient presents with  . Hypertension    HPI:   Patient is a 73 y.o. female with past medical history significant for DM2, HTN, CKD3, gout anemia from ulcer and bleeding AVM who presents today for routine followup  Last OV May 2020 - telemedicine Started on metformin for A1c > 7.0 - tolerating well  Brings in BP and glu reading for past week BP 120-143/67-78 Glc 116-145  She has been back to working her weight watchers program She denies any sx of hypoglycemia Has not been notified from pharmacy regarding her metformin has been recalled She will be getting flu vaccine [facility] June 8th with Dr Tobie Poet from [facility] - patient reports normal exam  Lab Results  Component Value Date   HGBA1C 8.3 (H) 03/13/2019   HGBA1C 6.6 (A) 08/04/2018   HGBA1C 7.4 01/27/2018   Lab Results  Component Value Date   LDLCALC 96 03/13/2019   CREATININE 1.07 (H) 03/13/2019    Patient Care Team: Rutherford Guys, MD as PCP - General (Family Medicine) Adrian Prows, MD as Consulting Physician (Cardiology) Rosita Fire, MD as Consulting Physician (Nephrology)  Depression screen [facility] 2/9 05/31/2019 02/07/2019 11/28/2018  Decreased Interest 0 0 0  Down, Depressed, Hopeless 0 0 0  PHQ - 2 Score 0 0 0    Fall Risk  05/31/2019 02/07/2019 11/28/2018 08/15/2018 08/04/2018  Falls in the past year? 0 0 0 1 1  Number falls in past yr: 0 0 0 1 -  Injury with Fall? 0 0 0 1 -  Comment - - - knees -     Allergies  Allergen Reactions  . Amlodipine Swelling  . Penicillins Hives    Childhood reaction Has patient had a PCN reaction causing immediate rash, facial/tongue/throat swelling, SOB or lightheadedness with hypotension: Yes Has patient had a PCN reaction causing severe rash involving mucus membranes or skin necrosis: No Has patient had a PCN reaction that required hospitalization: No Has  patient had a PCN reaction occurring within the last 10 years: No If all of the above answers are "NO", then may proceed with Cephalosporin use.  . Sulfonylureas     hypoglycemia    Prior to Admission medications   Medication Sig Start Date End Date Taking? Authorizing Provider  allopurinol (ZYLOPRIM) 100 MG tablet Take 100 mg by mouth daily before supper.  02/13/18  Yes [provider]  atorvastatin (LIPITOR) 40 MG tablet Take 1 tablet (40 mg total) by mouth daily. Patient taking differently: Take 40 mg by mouth.  08/04/18  Yes Timmothy Euler, [place] D, PA-C  blood glucose meter kit and supplies KIT Check fasting blood sugar once daily. Use diagnosis code of E11.9. 03/08/18  Yes [name], PA-C  furosemide (LASIX) 40 MG tablet Take 40 mg by mouth daily.   Yes [provider]  lisinopril (ZESTRIL) 20 MG tablet TAKE 1 TABLET BY MOUTH EVERY DAY 04/13/19  Yes Rutherford Guys, MD  metFORMIN (GLUCOPHAGE-XR) 750 MG 24 hr tablet Take 1 tablet (750 mg total) by mouth daily with breakfast. 03/16/19  Yes Rutherford Guys, MD  metoprolol succinate (TOPROL-XL) 50 MG 24 hr tablet TAKE 1 TABLET BY MOUTH EVERY DAY 04/04/19  Yes Rutherford Guys, MD  [facility] DELICA LANCETS FINE MISC USE AS DIRECTED 09/01/18  Yes Horald Pollen, MD  [facility] VERIO test strip USE ONCE A DAY. 03/26/19  Yes Rutherford Guys, MD    Past Medical History:  Diagnosis Date  . Arthritis   . Blood transfusion without reported diagnosis   . Cataract   . Diabetes mellitus without complication ([place])    control by diet only  . Hyperlipidemia   . Hypertension     Past Surgical History:  Procedure Laterality Date  . ABDOMINAL HYSTERECTOMY  1974   Class V PAP  . APPENDECTOMY    . BIOPSY  08/09/2018   Procedure: BIOPSY;  Surgeon: Ronnette Juniper, MD;  Location: [facility];  Service: Gastroenterology;;  . COLONOSCOPY    . COLONOSCOPY WITH PROPOFOL N/A 08/09/2018   Procedure: COLONOSCOPY WITH PROPOFOL;  Surgeon: Ronnette Juniper, MD;  Location: [facility];  Service: Gastroenterology;  Laterality: N/A;  . ESOPHAGOGASTRODUODENOSCOPY (EGD) WITH PROPOFOL N/A 08/09/2018   Procedure: ESOPHAGOGASTRODUODENOSCOPY (EGD) WITH PROPOFOL;  Surgeon: Ronnette Juniper, MD;  Location: [facility];  Service: Gastroenterology;  Laterality: N/A;  . HOT HEMOSTASIS N/A 08/09/2018   Procedure: HOT HEMOSTASIS (ARGON PLASMA COAGULATION/BICAP);  Surgeon: Ronnette Juniper, MD;  Location: [facility];  Service: Gastroenterology;  Laterality: N/A;  . SPINAL CORD DECOMPRESSION  03/28/2013   L5 S1   Dr Rolena Infante  . TONSILLECTOMY      Social History   Tobacco Use  . Smoking status: Never Smoker  . Smokeless tobacco: Never Used  Substance Use Topics  . Alcohol use: No    Family History  Problem Relation Age of Onset  . Stroke Mother   . Diabetes Mother   . Heart disease Mother   . Hyperlipidemia Mother   . Heart disease Father   . Cancer Sister        Ovarian and stomach cancer  . Diabetes Maternal Grandmother   . Heart disease Maternal Grandmother   . Cancer Maternal Grandfather   . Arthritis Paternal Grandmother     Review of Systems  Constitutional: Negative for chills and fever.  Respiratory: Negative for cough and shortness of breath.   Cardiovascular: Negative for chest pain, palpitations and leg swelling.  Gastrointestinal: Negative for abdominal pain, nausea and vomiting.     OBJECTIVE:  Today's Vitals   05/31/19 1558  BP: 132/80  Temp: (!) 80 F (26.7 C)  TempSrc: Oral  SpO2: 96%  Weight: 189 lb 6.4 oz (85.9 kg)  Height: 5' 2"  (1.575 m)   Body mass index is 34.64 kg/m.  Wt Readings from Last 3 Encounters:  05/31/19 189 lb 6.4 oz (85.9 kg)  02/22/19 206 lb (93.4 kg)  02/07/19 209 lb (94.8 kg)    Physical Exam Vitals signs and nursing note reviewed.  Constitutional:      Appearance: She is well-developed.  HENT:     Head: Normocephalic and atraumatic.     Mouth/Throat:     Pharynx: No oropharyngeal  exudate.  Eyes:     General: No scleral icterus.    Conjunctiva/sclera: Conjunctivae normal.     Pupils: Pupils are equal, round, and reactive to light.  Neck:     Musculoskeletal: Neck supple.  Cardiovascular:     Rate and Rhythm: Normal rate and regular rhythm.     Heart sounds: Normal heart sounds. No murmur. No friction rub. No gallop.   Pulmonary:     Effort: Pulmonary effort is normal.     Breath sounds: Normal breath sounds. No wheezing or rales.  Skin:    General: Skin is warm and dry.  Neurological:     Mental Status: She is alert  and oriented to person, place, and time.     No results found for this or any previous visit (from the past 24 hour(s)).  No results found.   ASSESSMENT and PLAN  1. Type 2 diabetes mellitus with stage 3 chronic kidney disease, without long-term current use of insulin (Erica Thomas) Congratulated patient on weight loss. Checking labs today, medications will be adjusted as needed.  - CMP14+EGFR - Hemoglobin A1c  2. Essential hypertension Controlled. Continue current regime.   3. Hyperlipidemia, unspecified hyperlipidemia type Checking labs today, medications will be adjusted as needed.  - Lipid panel  4. Need for vaccination - Flu Vaccine QUAD High Dose(Fluad)  Return in about 6 months (around 12/01/2019).    Rutherford Guys, MD Primary Care at [place], [place] [address] Ph.  [phone] Fax [phone]

## 2019-06-01 LAB — HEMOGLOBIN A1C
Est. average glucose Bld gHb Est-mCnc: 154 mg/dL
Hgb A1c MFr Bld: 7 % — ABNORMAL HIGH (ref 4.8–5.6)

## 2019-06-01 LAB — LIPID PANEL
Chol/HDL Ratio: 3.4 ratio (ref 0.0–4.4)
Cholesterol, Total: 160 mg/dL (ref 100–199)
HDL: 47 mg/dL (ref >39)
LDL Calculated: 80 mg/dL (ref 0–99)
Triglycerides: 163 mg/dL — ABNORMAL HIGH (ref 0–149)
VLDL Cholesterol Cal: 33 mg/dL (ref 5–40)

## 2019-06-01 LAB — CMP14+EGFR
ALT: 12 IU/L (ref 0–32)
AST: 12 IU/L (ref 0–40)
Albumin/Globulin Ratio: 1.8 (ref 1.2–2.2)
Albumin: 4.2 g/dL (ref 3.7–4.7)
Alkaline Phosphatase: 122 IU/L — ABNORMAL HIGH (ref 39–117)
BUN/Creatinine Ratio: 24 (ref 12–28)
BUN: 34 mg/dL — ABNORMAL HIGH (ref 8–27)
Bilirubin Total: 0.3 mg/dL (ref 0.0–1.2)
CO2: 22 mmol/L (ref 20–29)
Calcium: 9.7 mg/dL (ref 8.7–10.3)
Chloride: 101 mmol/L (ref 96–106)
Creatinine, Ser: 1.41 mg/dL — ABNORMAL HIGH (ref 0.57–1.00)
GFR calc Af Amer: 42 mL/min/{1.73_m2} — ABNORMAL LOW (ref >59)
GFR calc non Af Amer: 36 mL/min/{1.73_m2} — ABNORMAL LOW (ref >59)
Globulin, Total: 2.4 g/dL (ref 1.5–4.5)
Glucose: 87 mg/dL (ref 65–99)
Potassium: 4.6 mmol/L (ref 3.5–5.2)
Sodium: 141 mmol/L (ref 134–144)
Total Protein: 6.6 g/dL (ref 6.0–8.5)

## 2019-06-02 ENCOUNTER — Other Ambulatory Visit: Payer: Self-pay | Admitting: Family Medicine

## 2019-06-03 ENCOUNTER — Other Ambulatory Visit: Payer: Self-pay | Admitting: Emergency Medicine

## 2019-06-17 ENCOUNTER — Other Ambulatory Visit: Payer: Self-pay | Admitting: Family Medicine

## 2019-07-17 DIAGNOSIS — N183 Chronic kidney disease, stage 3 unspecified: Secondary | ICD-10-CM | POA: Diagnosis not present

## 2019-07-17 DIAGNOSIS — E1129 Type 2 diabetes mellitus with other diabetic kidney complication: Secondary | ICD-10-CM | POA: Diagnosis not present

## 2019-07-24 DIAGNOSIS — Z1231 Encounter for screening mammogram for malignant neoplasm of breast: Secondary | ICD-10-CM | POA: Diagnosis not present

## 2019-07-24 LAB — HM MAMMOGRAPHY

## 2019-07-25 DIAGNOSIS — N2581 Secondary hyperparathyroidism of renal origin: Secondary | ICD-10-CM | POA: Diagnosis not present

## 2019-07-25 DIAGNOSIS — Z1159 Encounter for screening for other viral diseases: Secondary | ICD-10-CM | POA: Diagnosis not present

## 2019-07-25 DIAGNOSIS — I129 Hypertensive chronic kidney disease with stage 1 through stage 4 chronic kidney disease, or unspecified chronic kidney disease: Secondary | ICD-10-CM | POA: Diagnosis not present

## 2019-07-25 DIAGNOSIS — R600 Localized edema: Secondary | ICD-10-CM | POA: Diagnosis not present

## 2019-07-25 DIAGNOSIS — D509 Iron deficiency anemia, unspecified: Secondary | ICD-10-CM | POA: Diagnosis not present

## 2019-07-25 DIAGNOSIS — M109 Gout, unspecified: Secondary | ICD-10-CM | POA: Diagnosis not present

## 2019-07-25 DIAGNOSIS — N183 Chronic kidney disease, stage 3 unspecified: Secondary | ICD-10-CM | POA: Diagnosis not present

## 2019-08-20 ENCOUNTER — Other Ambulatory Visit: Payer: Self-pay | Admitting: Emergency Medicine

## 2019-09-03 ENCOUNTER — Telehealth: Payer: Self-pay | Admitting: Family Medicine

## 2019-09-03 NOTE — Telephone Encounter (Signed)
Pt needs to know if she needs an appointment for med refills. She called not sure at all. She would love a cb at [phone]

## 2019-09-07 ENCOUNTER — Other Ambulatory Visit: Payer: Self-pay | Admitting: Family Medicine

## 2019-09-07 ENCOUNTER — Other Ambulatory Visit: Payer: Self-pay | Admitting: Emergency Medicine

## 2019-09-07 DIAGNOSIS — I10 Essential (primary) hypertension: Secondary | ICD-10-CM

## 2019-09-07 DIAGNOSIS — N183 Chronic kidney disease, stage 3 unspecified: Secondary | ICD-10-CM

## 2019-09-07 MED ORDER — LISINOPRIL 20 MG PO TABS: 20.0000 mg | ORAL_TABLET | Freq: Every day | ORAL | 0 refills | Status: DC

## 2019-09-07 MED ORDER — METOPROLOL SUCCINATE ER 50 MG PO TB24: 50.0000 mg | ORAL_TABLET | Freq: Every day | ORAL | 0 refills | Status: DC

## 2019-09-07 MED ORDER — METFORMIN HCL ER 750 MG PO TB24: 750.0000 mg | ORAL_TABLET | Freq: Every day | ORAL | 0 refills | Status: DC

## 2019-09-07 NOTE — Telephone Encounter (Signed)
30 day supply was sent in till her appt in [place]

## 2019-09-07 NOTE — Telephone Encounter (Signed)
Forwarding medication refill request to the clinical pool for review. 

## 2019-09-21 ENCOUNTER — Other Ambulatory Visit: Payer: Self-pay | Admitting: Family Medicine

## 2019-09-29 DIAGNOSIS — E119 Type 2 diabetes mellitus without complications: Secondary | ICD-10-CM | POA: Diagnosis not present

## 2019-09-29 DIAGNOSIS — H33012 Retinal detachment with single break, left eye: Secondary | ICD-10-CM | POA: Diagnosis not present

## 2019-09-29 DIAGNOSIS — H25812 Combined forms of age-related cataract, left eye: Secondary | ICD-10-CM | POA: Diagnosis not present

## 2019-09-29 DIAGNOSIS — H2511 Age-related nuclear cataract, right eye: Secondary | ICD-10-CM | POA: Diagnosis not present

## 2019-09-29 DIAGNOSIS — H40003 Preglaucoma, unspecified, bilateral: Secondary | ICD-10-CM | POA: Diagnosis not present

## 2019-09-29 DIAGNOSIS — Z20828 Contact with and (suspected) exposure to other viral communicable diseases: Secondary | ICD-10-CM | POA: Diagnosis not present

## 2019-09-29 DIAGNOSIS — H33032 Retinal detachment with giant retinal tear, left eye: Secondary | ICD-10-CM | POA: Diagnosis not present

## 2019-10-22 ENCOUNTER — Other Ambulatory Visit: Payer: Self-pay

## 2019-10-22 ENCOUNTER — Ambulatory Visit (INDEPENDENT_AMBULATORY_CARE_PROVIDER_SITE_OTHER): Payer: Medicare Other | Admitting: Family Medicine

## 2019-10-22 ENCOUNTER — Encounter: Payer: Self-pay | Admitting: Family Medicine

## 2019-10-22 VITALS — BP 148/81 | HR 67 | Temp 98.7°F | Resp 17 | Ht 62.0 in | Wt 183.0 lb

## 2019-10-22 DIAGNOSIS — E785 Hyperlipidemia, unspecified: Secondary | ICD-10-CM

## 2019-10-22 DIAGNOSIS — F4321 Adjustment disorder with depressed mood: Secondary | ICD-10-CM

## 2019-10-22 DIAGNOSIS — I10 Essential (primary) hypertension: Secondary | ICD-10-CM | POA: Diagnosis not present

## 2019-10-22 DIAGNOSIS — Z634 Disappearance and death of family member: Secondary | ICD-10-CM

## 2019-10-22 DIAGNOSIS — N183 Chronic kidney disease, stage 3 unspecified: Secondary | ICD-10-CM

## 2019-10-22 DIAGNOSIS — E1122 Type 2 diabetes mellitus with diabetic chronic kidney disease: Secondary | ICD-10-CM

## 2019-10-22 DIAGNOSIS — H3322 Serous retinal detachment, left eye: Secondary | ICD-10-CM

## 2019-10-22 MED ORDER — LISINOPRIL 20 MG PO TABS: 20.0000 mg | ORAL_TABLET | Freq: Every day | ORAL | 1 refills | Status: DC

## 2019-10-22 MED ORDER — METOPROLOL SUCCINATE ER 50 MG PO TB24: 50.0000 mg | ORAL_TABLET | Freq: Every day | ORAL | 1 refills | Status: DC

## 2019-10-22 MED ORDER — ATORVASTATIN CALCIUM 40 MG PO TABS: 40.0000 mg | ORAL_TABLET | Freq: Every day | ORAL | 3 refills | Status: DC

## 2019-10-22 MED ORDER — METFORMIN HCL ER 750 MG PO TB24: 750.0000 mg | ORAL_TABLET | Freq: Every day | ORAL | 1 refills | Status: DC

## 2019-10-22 NOTE — Progress Notes (Signed)
1/18/20215:29 PM  Erica Thomas 07/27/1946, 74 y.o., female [id number]  Chief Complaint  Patient presents with  . medication    refill     HPI:   Patient is a 74 y.o. female with past medical history significant for DM2, HTN, CKD3, gout anemia from ulcer and bleeding AVMwho presents today for routine followup  Last ov aug 2020 In dec 2020 started having vision changes, diagnosed with retinal detachment, had surgery done immediately, vision getting better  Her only son committed suicide early this month Doing as well as expected  Continues to see renal rx allopurinol and lasix Started her on iron and vitamin d  This morning cbg 106, yesterday 97  Wt Readings from Last 3 Encounters:  10/22/19 183 lb (83 kg)  05/31/19 189 lb 6.4 oz (85.9 kg)  02/22/19 206 lb (93.4 kg)    Lab Results  Component Value Date   HGBA1C 7.0 (H) 05/31/2019   HGBA1C 8.3 (H) 03/13/2019   HGBA1C 6.6 (A) 08/04/2018   Lab Results  Component Value Date   LDLCALC 80 05/31/2019   CREATININE 1.41 (H) 05/31/2019    Depression screen PHQ 2/9 05/31/2019 02/07/2019 11/28/2018  Decreased Interest 0 0 0  Down, Depressed, Hopeless 0 0 0  PHQ - 2 Score 0 0 0    Fall Risk  05/31/2019 02/07/2019 11/28/2018 08/15/2018 08/04/2018  Falls in the past year? 0 0 0 1 1  Number falls in past yr: 0 0 0 1 -  Injury with Fall? 0 0 0 1 -  Comment - - - knees -     Allergies  Allergen Reactions  . Amlodipine Swelling  . Penicillins Hives    Childhood reaction Has patient had a PCN reaction causing immediate rash, facial/tongue/throat swelling, SOB or lightheadedness with hypotension: Yes Has patient had a PCN reaction causing severe rash involving mucus membranes or skin necrosis: No Has patient had a PCN reaction that required hospitalization: No Has patient had a PCN reaction occurring within the last 10 years: No If all of the above answers are "NO", then may proceed with Cephalosporin use.  . Sulfonylureas      hypoglycemia    Prior to Admission medications   Medication Sig Start Date End Date Taking? Authorizing Provider  allopurinol (ZYLOPRIM) 100 MG tablet Take 100 mg by mouth daily before supper.  02/13/18  Yes [provider]  atorvastatin (LIPITOR) 40 MG tablet Take 1 tablet (40 mg total) by mouth daily. Patient taking differently: Take 40 mg by mouth.  08/04/18  Yes Timmothy Euler, [place] D, PA-C  blood glucose meter kit and supplies KIT Check fasting blood sugar once daily. Use diagnosis code of E11.9. 03/08/18  Yes [name], PA-C  furosemide (LASIX) 40 MG tablet Take 40 mg by mouth daily.   Yes [provider]  Lancets (ONETOUCH DELICA PLUS [id number]) MISC USE AS DIRECTED 08/20/19  Yes Rutherford Guys, MD  lisinopril (ZESTRIL) 20 MG tablet TAKE 1 TABLET BY MOUTH EVERY DAY 09/10/19  Yes Rutherford Guys, MD  metFORMIN (GLUCOPHAGE-XR) 750 MG 24 hr tablet TAKE 1 TABLET BY MOUTH EVERY DAY WITH BREAKFAST 09/10/19  Yes Rutherford Guys, MD  metoprolol succinate (TOPROL-XL) 50 MG 24 hr tablet TAKE 1 TABLET BY MOUTH EVERY DAY 09/10/19  Yes Rutherford Guys, MD  [facility] VERIO test strip USE ONCE DAILY AS DIRECTED 09/21/19  Yes Rutherford Guys, MD    Past Medical History:  Diagnosis Date  . Arthritis   .  Blood transfusion without reported diagnosis   . Cataract   . Diabetes mellitus without complication ([place])    control by diet only  . Hyperlipidemia   . Hypertension     Past Surgical History:  Procedure Laterality Date  . ABDOMINAL HYSTERECTOMY  1974   Class V PAP  . APPENDECTOMY    . BIOPSY  08/09/2018   Procedure: BIOPSY;  Surgeon: Ronnette Juniper, MD;  Location: [place];  Service: Gastroenterology;;  . COLONOSCOPY    . COLONOSCOPY WITH PROPOFOL N/A 08/09/2018   Procedure: COLONOSCOPY WITH PROPOFOL;  Surgeon: Ronnette Juniper, MD;  Location: [facility];  Service: Gastroenterology;  Laterality: N/A;  . ESOPHAGOGASTRODUODENOSCOPY (EGD) WITH PROPOFOL N/A 08/09/2018    Procedure: ESOPHAGOGASTRODUODENOSCOPY (EGD) WITH PROPOFOL;  Surgeon: Ronnette Juniper, MD;  Location: [place];  Service: Gastroenterology;  Laterality: N/A;  . HOT HEMOSTASIS N/A 08/09/2018   Procedure: HOT HEMOSTASIS (ARGON PLASMA COAGULATION/BICAP);  Surgeon: Ronnette Juniper, MD;  Location: [place];  Service: Gastroenterology;  Laterality: N/A;  . SPINAL CORD DECOMPRESSION  03/28/2013   L5 S1   Dr Rolena Infante  . TONSILLECTOMY      Social History   Tobacco Use  . Smoking status: Never Smoker  . Smokeless tobacco: Never Used  Substance Use Topics  . Alcohol use: No    Family History  Problem Relation Age of Onset  . Stroke Mother   . Diabetes Mother   . Heart disease Mother   . Hyperlipidemia Mother   . Heart disease Father   . Cancer Sister        Ovarian and stomach cancer  . Diabetes Maternal Grandmother   . Heart disease Maternal Grandmother   . Cancer Maternal Grandfather   . Arthritis Paternal Grandmother     Review of Systems  Constitutional: Negative for chills and fever.  Respiratory: Negative for cough and shortness of breath.   Cardiovascular: Negative for chest pain, palpitations and leg swelling.  Gastrointestinal: Negative for abdominal pain, nausea and vomiting.   Per hpi  OBJECTIVE:  Today's Vitals   10/22/19 1655  BP: (!) 148/81  Pulse: 67  Resp: 17  Temp: 98.7 F (37.1 C)  TempSrc: Oral  SpO2: 98%  Weight: 183 lb (83 kg)  Height: 5' 2"  (1.575 m)   Body mass index is 33.47 kg/m.   Physical Exam Vitals and nursing note reviewed.  Constitutional:      Appearance: She is well-developed.  HENT:     Head: Normocephalic and atraumatic.     Mouth/Throat:     Pharynx: No oropharyngeal exudate.  Eyes:     General: No scleral icterus.    Conjunctiva/sclera:     Right eye: Right conjunctiva is not injected.     Left eye: Left conjunctiva is injected.     Pupils: Pupils are equal, round, and reactive to light.  Cardiovascular:     Rate and  Rhythm: Normal rate and regular rhythm.     Heart sounds: Normal heart sounds. No murmur. No friction rub. No gallop.   Pulmonary:     Effort: Pulmonary effort is normal.     Breath sounds: Normal breath sounds. No wheezing or rales.  Musculoskeletal:     Cervical back: Neck supple.     Right lower leg: No edema.     Left lower leg: No edema.  Skin:    General: Skin is warm and dry.  Neurological:     Mental Status: She is alert and oriented to person, place, and  time.     No results found for this or any previous visit (from the past 24 hour(s)).  No results found.   ASSESSMENT and PLAN  1. Grief at loss of child Has good family support, considering grief counseling  2. Left retinal detachment S/p surgery. Under ophtho care  3. Essential hypertension Managed by renal.  - metoprolol succinate (TOPROL-XL) 50 MG 24 hr tablet; Take 1 tablet (50 mg total) by mouth daily. Take with or immediately following a meal. - lisinopril (ZESTRIL) 20 MG tablet; Take 1 tablet (20 mg total) by mouth daily. - Comprehensive metabolic panel; Future  4. Hyperlipidemia, unspecified hyperlipidemia type Checking labs, medications will be adjusted as needed.  - atorvastatin (LIPITOR) 40 MG tablet; Take 1 tablet (40 mg total) by mouth daily. - Lipid panel; Future  5. Type 2 diabetes mellitus with stage 3 chronic kidney disease, without long-term current use of insulin, unspecified whether stage 3a or 3b CKD ([place]) Checking labs, medications will be adjusted as needed. Cont with LFM - metFORMIN (GLUCOPHAGE-XR) 750 MG 24 hr tablet; Take 1 tablet (750 mg total) by mouth daily with breakfast. - Hemoglobin A1c; Future - Microalbumin / creatinine urine ratio; Future  Return for aug 2021,.    Rutherford Guys, MD Primary Care at [facility] [place], [place] [address] Ph.  [phone] Fax [phone]

## 2019-11-15 IMAGING — US US RENAL
1 series · 14 of 25 positions shown · non-contrast
Comparison: None.

CLINICAL DATA: Chronic kidney disease

EXAM:
RENAL / URINARY TRACT ULTRASOUND COMPLETE

[Series 1: us renal · 0.25mm/px · 14 of 31 slices shown]
[im 1/31]
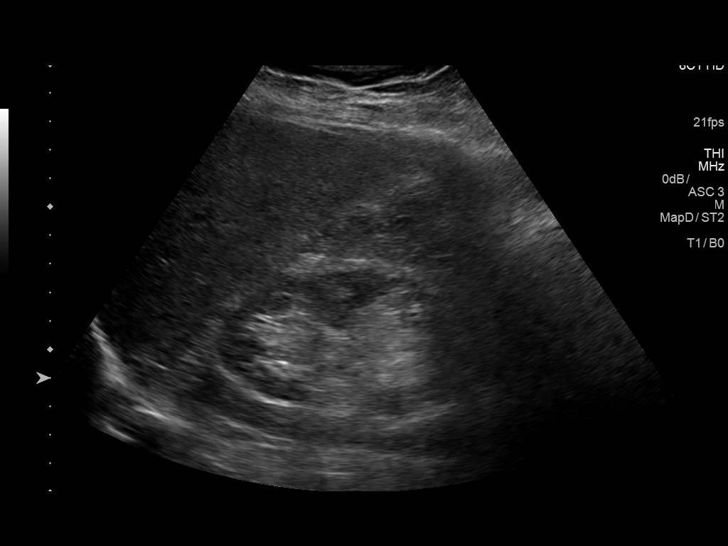
[im 3/31]
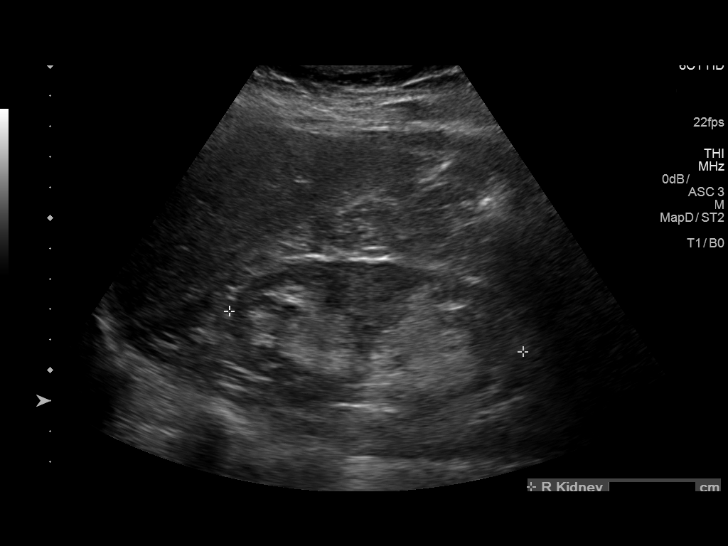
[im 6/31]
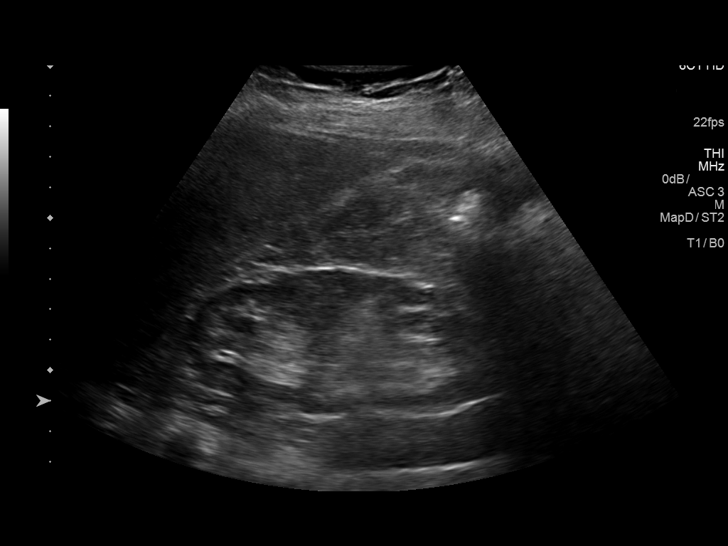
[im 8/31]
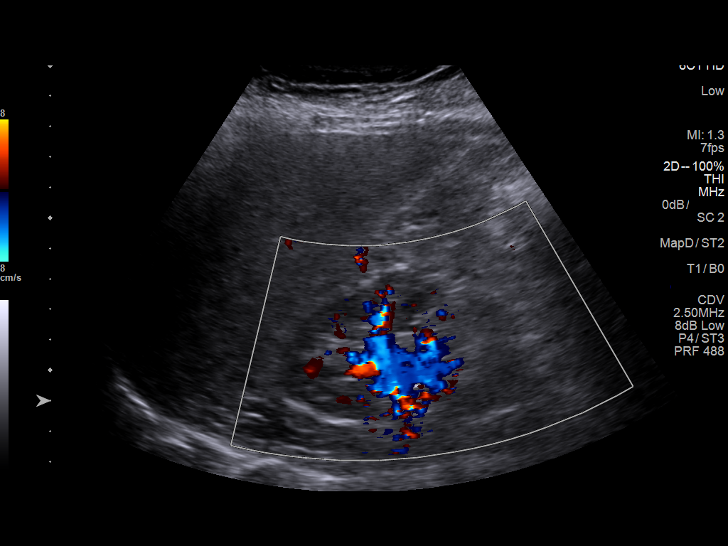
[im 11/31]
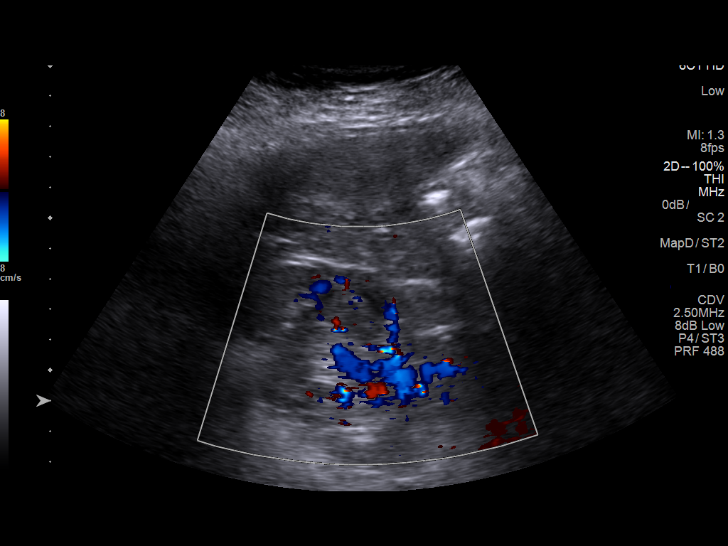
[im 12/31]
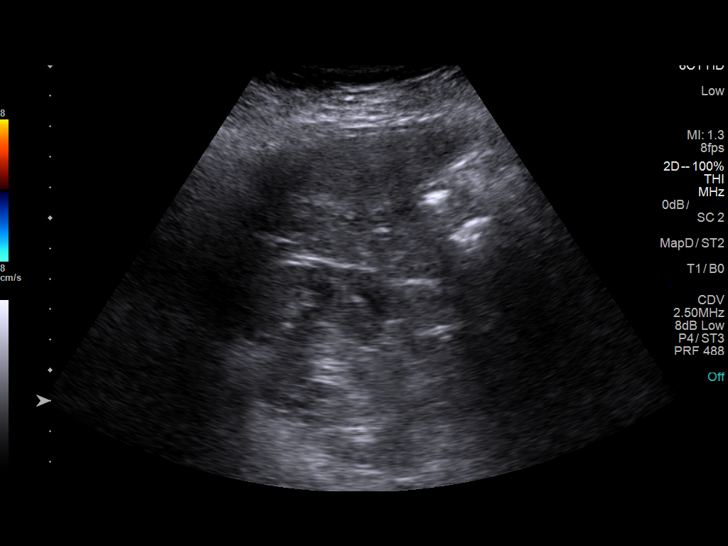
[im 14/31]
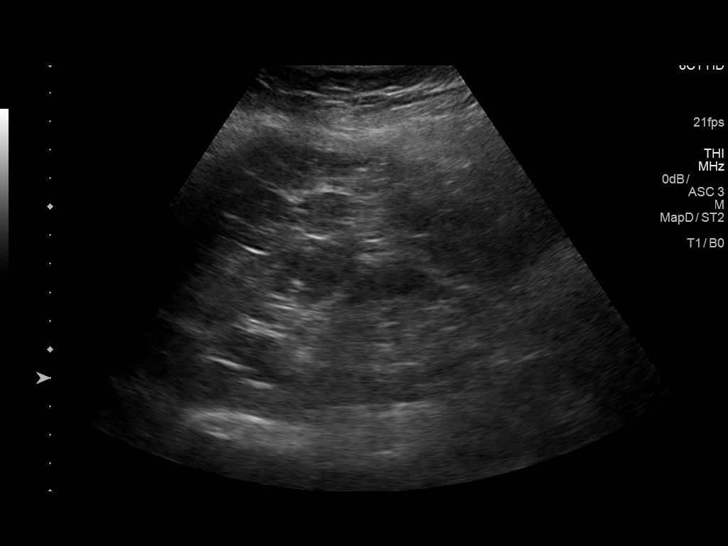
[im 17/31]
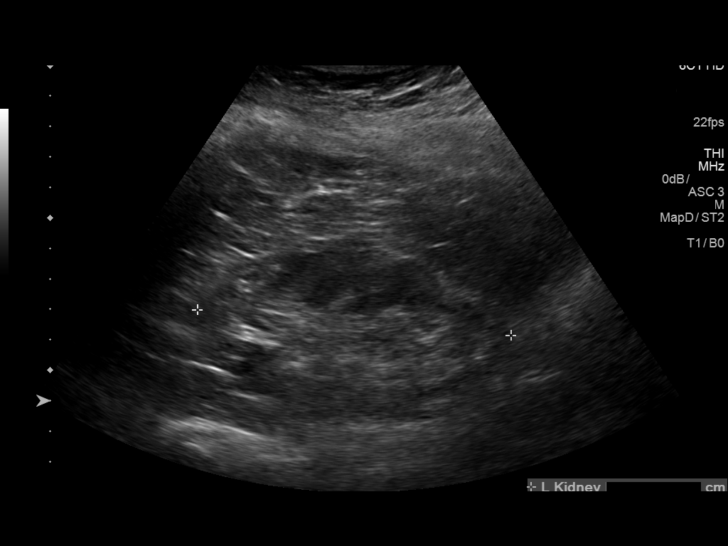
[im 19/31]
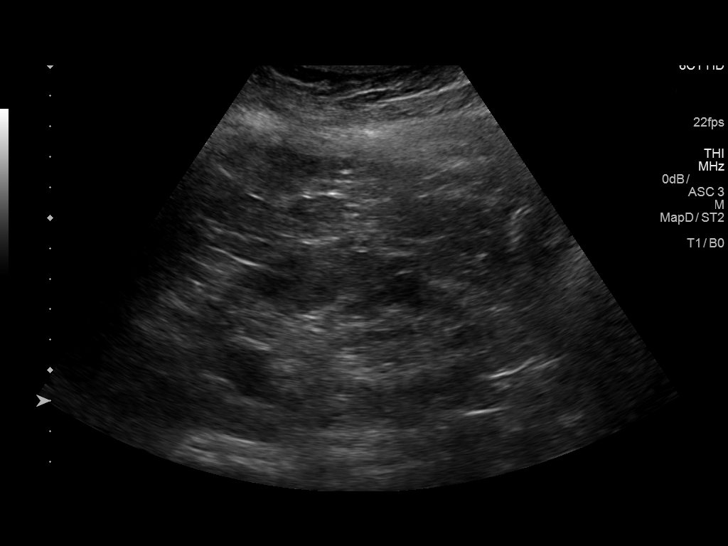
[im 21/31]
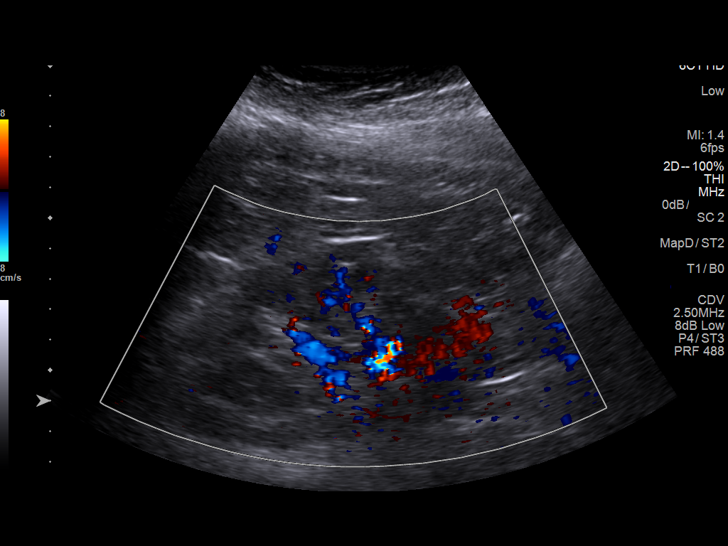
[im 23/31]
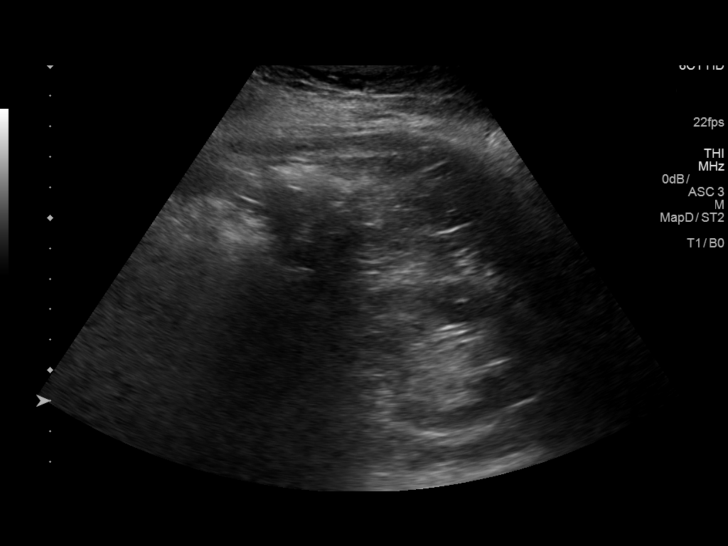
[im 26/31]
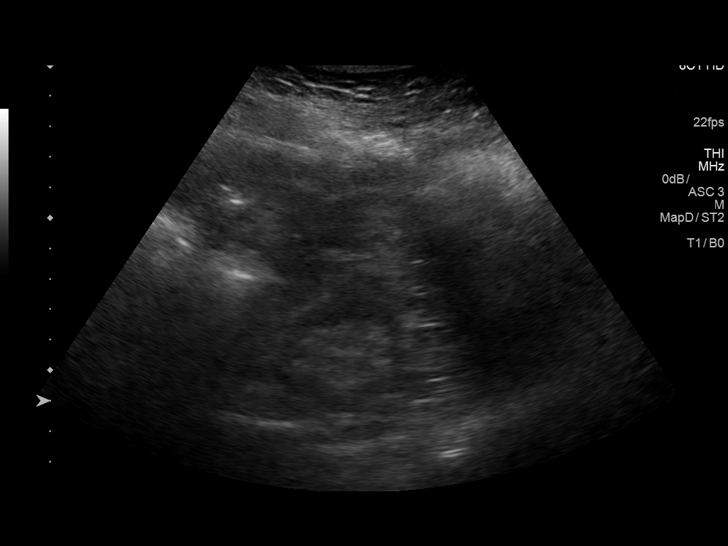
[im 28/31]
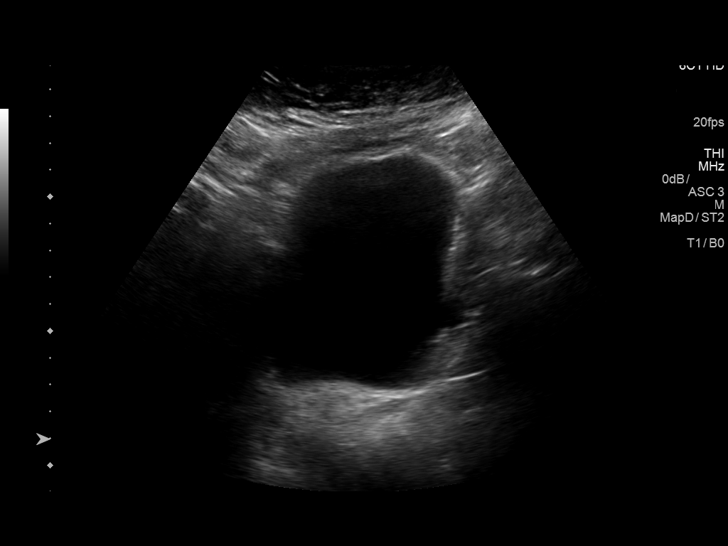
[im 31/31]
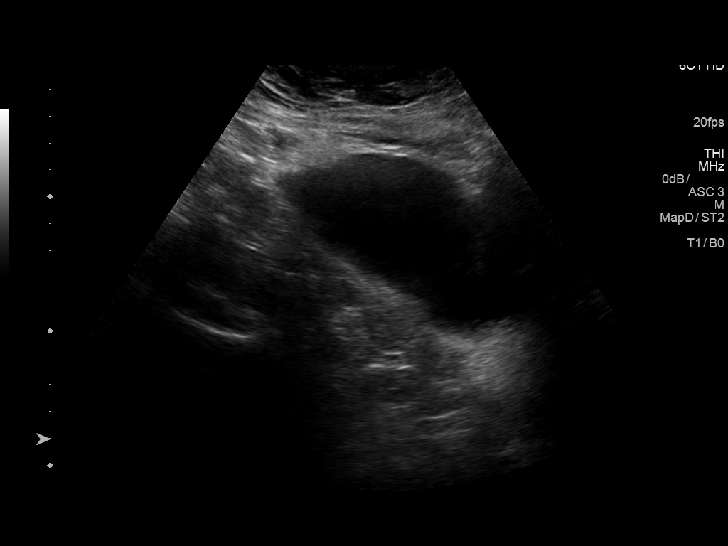

[14 of 25 positions shown; findings below may reference images not displayed]

FINDINGS: Right Kidney:

Length: 9.7 cm. Echogenicity within normal limits. No mass or
hydronephrosis visualized.

Left Kidney:

Length: 10.3 cm. Echogenicity within normal limits. No mass or
hydronephrosis visualized.

Bladder:

Appears normal for degree of bladder distention.
IMPRESSION: Negative renal ultrasound.

## 2019-12-14 ENCOUNTER — Other Ambulatory Visit: Payer: Self-pay | Admitting: Family Medicine

## 2020-01-28 ENCOUNTER — Encounter: Payer: Self-pay | Admitting: Family Medicine

## 2020-01-28 LAB — HM DIABETES EYE EXAM

## 2020-05-06 ENCOUNTER — Other Ambulatory Visit: Payer: Self-pay | Admitting: Family Medicine

## 2020-05-06 DIAGNOSIS — N183 Chronic kidney disease, stage 3 unspecified: Secondary | ICD-10-CM

## 2020-05-06 NOTE — Telephone Encounter (Signed)
Pt has appt 05/16/2020

## 2020-05-12 ENCOUNTER — Ambulatory Visit: Payer: Medicare Other | Admitting: Family Medicine

## 2020-05-16 ENCOUNTER — Encounter: Payer: Self-pay | Admitting: Family Medicine

## 2020-05-16 ENCOUNTER — Ambulatory Visit (INDEPENDENT_AMBULATORY_CARE_PROVIDER_SITE_OTHER): Payer: Medicare Other | Admitting: Family Medicine

## 2020-05-16 ENCOUNTER — Other Ambulatory Visit: Payer: Self-pay

## 2020-05-16 VITALS — BP 130/68 | HR 71 | Temp 98.0°F | Ht 62.0 in | Wt 192.8 lb

## 2020-05-16 DIAGNOSIS — E782 Mixed hyperlipidemia: Secondary | ICD-10-CM | POA: Diagnosis not present

## 2020-05-16 DIAGNOSIS — E1122 Type 2 diabetes mellitus with diabetic chronic kidney disease: Secondary | ICD-10-CM

## 2020-05-16 DIAGNOSIS — I10 Essential (primary) hypertension: Secondary | ICD-10-CM

## 2020-05-16 DIAGNOSIS — N183 Chronic kidney disease, stage 3 unspecified: Secondary | ICD-10-CM

## 2020-05-16 MED ORDER — ONETOUCH VERIO VI STRP: ORAL_STRIP | 2 refills | Status: DC

## 2020-05-16 NOTE — Patient Instructions (Signed)
° ° ° °  If you have lab work done today you will be contacted with your lab results within the next 2 weeks.  If you have not heard from us then please contact us. The fastest way to get your results is to register for My Chart. ° ° °IF you received an x-ray today, you will receive an invoice from [facility]. Please contact [facility] at [phone] with questions or concerns regarding your invoice.  ° °IF you received labwork today, you will receive an invoice from [facility]. Please contact [facility] at [phone] with questions or concerns regarding your invoice.  ° °Our billing staff will not be able to assist you with questions regarding bills from these companies. ° °You will be contacted with the lab results as soon as they are available. The fastest way to get your results is to activate your My Chart account. Instructions are located on the last page of this paperwork. If you have not heard from us regarding the results in 2 weeks, please contact this office. °  ° ° ° °

## 2020-05-16 NOTE — Progress Notes (Signed)
8/13/20213:37 PM  Erica Thomas 02/08/1946, 74 y.o., female [id number]  Chief Complaint  Patient presents with   Hypertension   Medical Management of Chronic Issues    46 m f/u     HPI:   Patient is a 74 y.o. female with past medical history significant for DM2, HTN, CKD3,goutanemia from ulcer and bleeding AVMwho presents today for routine followup  Last OV jan 2021 - no changes She is overall doing well She saw renal about 2 weeks ago - GFR stable, increased her vitamin D and iron, both to BID. Fu 1 year Cont to see retinal specialist for retinal detachment, last visit in may 2021, vision 20/25, FU 1 year Doing ok - able to bury her son in June in [place] Has not been able to check glucose as ran out of test stripes Denies any sx hypoglycemia or hyperglycemia Completed covid vaccines  Lab Results  Component Value Date   HGBA1C 7.0 (H) 05/31/2019   HGBA1C 8.3 (H) 03/13/2019   HGBA1C 6.6 (A) 08/04/2018   Lab Results  Component Value Date   LDLCALC 80 05/31/2019   CREATININE 1.41 (H) 05/31/2019    Depression screen PHQ 2/9 05/16/2020 05/31/2019 02/07/2019  Decreased Interest 0 0 0  Down, Depressed, Hopeless 0 0 0  PHQ - 2 Score 0 0 0    Fall Risk  05/16/2020 05/31/2019 02/07/2019 11/28/2018 08/15/2018  Falls in the past year? 0 0 0 0 1  Number falls in past yr: 0 0 0 0 1  Injury with Fall? 0 0 0 0 1  Comment - - - - knees  Follow up Falls evaluation completed - - - -     Allergies  Allergen Reactions   Amlodipine Swelling   Penicillins Hives    Childhood reaction Has patient had a PCN reaction causing immediate rash, facial/tongue/throat swelling, SOB or lightheadedness with hypotension: Yes Has patient had a PCN reaction causing severe rash involving mucus membranes or skin necrosis: No Has patient had a PCN reaction that required hospitalization: No Has patient had a PCN reaction occurring within the last 10 years: No If all of the above answers are "NO",  then may proceed with Cephalosporin use.   Sulfonylureas     hypoglycemia    Prior to Admission medications   Medication Sig Start Date End Date Taking? Authorizing Provider  allopurinol (ZYLOPRIM) 100 MG tablet Take 100 mg by mouth daily before supper.  02/13/18  Yes [provider]  atorvastatin (LIPITOR) 40 MG tablet Take 1 tablet (40 mg total) by mouth daily. 10/22/19  Yes Rutherford Guys, MD  blood glucose meter kit and supplies KIT Check fasting blood sugar once daily. Use diagnosis code of E11.9. 03/08/18  Yes [name], PA-C  furosemide (LASIX) 40 MG tablet Take 40 mg by mouth daily.   Yes [provider]  Lancets [facility] DELICA PLUS [id number]) [place] USE AS DIRECTED 08/20/19  Yes Rutherford Guys, MD  lisinopril (ZESTRIL) 20 MG tablet Take 1 tablet (20 mg total) by mouth daily. 10/22/19  Yes Rutherford Guys, MD  metFORMIN (GLUCOPHAGE-XR) 750 MG 24 hr tablet TAKE 1 TABLET BY MOUTH EVERY DAY WITH BREAKFAST 05/06/20  Yes Rutherford Guys, MD  metoprolol succinate (TOPROL-XL) 50 MG 24 hr tablet Take 1 tablet (50 mg total) by mouth daily. Take with or immediately following a meal. 10/22/19  Yes Rutherford Guys, MD  [facility] VERIO test strip USE ONCE DAILY AS DIRECTED 12/14/19  Yes  Rutherford Guys, MD    Past Medical History:  Diagnosis Date   Arthritis    Blood transfusion without reported diagnosis    Cataract    Diabetes mellitus without complication ([place])    control by diet only   Hyperlipidemia    Hypertension     Past Surgical History:  Procedure Laterality Date   ABDOMINAL HYSTERECTOMY  1974   Class V PAP   APPENDECTOMY     BIOPSY  08/09/2018   Procedure: BIOPSY;  Surgeon: Ronnette Juniper, MD;  Location: [facility] ENDOSCOPY;  Service: Gastroenterology;;   COLONOSCOPY     COLONOSCOPY WITH PROPOFOL N/A 08/09/2018   Procedure: COLONOSCOPY WITH PROPOFOL;  Surgeon: Ronnette Juniper, MD;  Location: [facility];  Service: Gastroenterology;  Laterality: N/A;    ESOPHAGOGASTRODUODENOSCOPY (EGD) WITH PROPOFOL N/A 08/09/2018   Procedure: ESOPHAGOGASTRODUODENOSCOPY (EGD) WITH PROPOFOL;  Surgeon: Ronnette Juniper, MD;  Location: [facility];  Service: Gastroenterology;  Laterality: N/A;   HOT HEMOSTASIS N/A 08/09/2018   Procedure: HOT HEMOSTASIS (ARGON PLASMA COAGULATION/BICAP);  Surgeon: Ronnette Juniper, MD;  Location: [facility];  Service: Gastroenterology;  Laterality: N/A;   SPINAL CORD DECOMPRESSION  03/28/2013   L5 S1   Dr Rolena Infante   TONSILLECTOMY      Social History   Tobacco Use   Smoking status: Never Smoker   Smokeless tobacco: Never Used  Substance Use Topics   Alcohol use: No    Family History  Problem Relation Age of Onset   Stroke Mother    Diabetes Mother    Heart disease Mother    Hyperlipidemia Mother    Heart disease Father    Cancer Sister        Ovarian and stomach cancer   Diabetes Maternal Grandmother    Heart disease Maternal Grandmother    Cancer Maternal Grandfather    Arthritis Paternal Grandmother     Review of Systems  Constitutional: Negative for chills and fever.  Respiratory: Negative for cough and shortness of breath.   Cardiovascular: Negative for chest pain, palpitations and leg swelling.  Gastrointestinal: Negative for abdominal pain, nausea and vomiting.     OBJECTIVE:  Today's Vitals   05/16/20 1531 05/16/20 1541  BP: (!) 145/81 130/68  Pulse: 71   Temp: 98 F (36.7 C)   TempSrc: Temporal   SpO2: 99%   Weight: 192 lb 12.8 oz (87.5 kg)   Height: '5\' 2"'$  (1.575 m)    Body mass index is 35.26 kg/m.  Wt Readings from Last 3 Encounters:  05/16/20 192 lb 12.8 oz (87.5 kg)  10/22/19 183 lb (83 kg)  05/31/19 189 lb 6.4 oz (85.9 kg)    Physical Exam Vitals and nursing note reviewed.  Constitutional:      Appearance: She is well-developed.  HENT:     Head: Normocephalic and atraumatic.     Mouth/Throat:     Pharynx: No oropharyngeal exudate.  Eyes:     General: No scleral  icterus.    Extraocular Movements: Extraocular movements intact.     Conjunctiva/sclera: Conjunctivae normal.     Pupils: Pupils are equal, round, and reactive to light.  Cardiovascular:     Rate and Rhythm: Normal rate and regular rhythm.     Heart sounds: Normal heart sounds. No murmur heard.  No friction rub. No gallop.   Pulmonary:     Effort: Pulmonary effort is normal.     Breath sounds: Normal breath sounds. No wheezing, rhonchi or rales.  Musculoskeletal:     Cervical  back: Neck supple.     Right lower leg: No edema.     Left lower leg: No edema.  Skin:    General: Skin is warm and dry.  Neurological:     Mental Status: She is alert and oriented to person, place, and time.     No results found for this or any previous visit (from the past 24 hour(s)).  No results found.   ASSESSMENT and PLAN  1. Type 2 diabetes mellitus with stage 3 chronic kidney disease, without long-term current use of insulin, unspecified whether stage 3a or 3b CKD ([place]) Checking labs today, medications will be adjusted as needed.  - Hemoglobin A1c - Comprehensive metabolic panel - Microalbumin/Creatinine Ratio, Urine  2. HTN, goal below 140/80 Controlled. Continue current regime.   3. Mixed hyperlipidemia Checking labs today, medications will be adjusted as needed.  - Lipid panel  Other orders - glucose blood (ONETOUCH VERIO) test strip; USE ONCE DAILY AS DIRECTED  Return in about 6 months (around 11/16/2020).    Rutherford Guys, MD Primary Care at [facility] [place], [place] [address] Ph.  [phone] Fax [phone]

## 2020-05-17 LAB — HEMOGLOBIN A1C
Est. average glucose Bld gHb Est-mCnc: 143 mg/dL
Hgb A1c MFr Bld: 6.6 % — ABNORMAL HIGH (ref 4.8–5.6)

## 2020-05-17 LAB — COMPREHENSIVE METABOLIC PANEL WITH GFR
ALT: 23 [IU]/L (ref 0–32)
AST: 25 [IU]/L (ref 0–40)
Albumin/Globulin Ratio: 1.7 (ref 1.2–2.2)
Albumin: 4 g/dL (ref 3.7–4.7)
Alkaline Phosphatase: 150 [IU]/L — ABNORMAL HIGH (ref 48–121)
BUN/Creatinine Ratio: 20 (ref 12–28)
BUN: 26 mg/dL (ref 8–27)
Bilirubin Total: 0.5 mg/dL (ref 0.0–1.2)
CO2: 28 mmol/L (ref 20–29)
Calcium: 9.7 mg/dL (ref 8.7–10.3)
Chloride: 102 mmol/L (ref 96–106)
Creatinine, Ser: 1.3 mg/dL — ABNORMAL HIGH (ref 0.57–1.00)
GFR calc Af Amer: 46 mL/min/{1.73_m2} — ABNORMAL LOW
GFR calc non Af Amer: 40 mL/min/{1.73_m2} — ABNORMAL LOW
Globulin, Total: 2.4 g/dL (ref 1.5–4.5)
Glucose: 103 mg/dL — ABNORMAL HIGH (ref 65–99)
Potassium: 4.3 mmol/L (ref 3.5–5.2)
Sodium: 142 mmol/L (ref 134–144)
Total Protein: 6.4 g/dL (ref 6.0–8.5)

## 2020-05-17 LAB — LIPID PANEL
Chol/HDL Ratio: 3.1 ratio (ref 0.0–4.4)
Cholesterol, Total: 172 mg/dL (ref 100–199)
HDL: 55 mg/dL
LDL Chol Calc (NIH): 95 mg/dL (ref 0–99)
Triglycerides: 127 mg/dL (ref 0–149)
VLDL Cholesterol Cal: 22 mg/dL (ref 5–40)

## 2020-05-17 LAB — MICROALBUMIN / CREATININE URINE RATIO
Creatinine, Urine: 83.6 mg/dL
Microalb/Creat Ratio: 13 mg/g creat (ref 0–29)
Microalbumin, Urine: 10.8 ug/mL

## 2020-05-28 ENCOUNTER — Telehealth: Payer: Self-pay | Admitting: Family Medicine

## 2020-05-28 NOTE — Telephone Encounter (Signed)
Copied from [place] [id number]. Topic: General - Other >> May 28, 2020  3:11 PM Keene Breath wrote: Reason for CRM: Patient would like the nurse to call her regarding her prescription for strips.  She stated that the pharmacy said that they need to have the diagnosis on the order.  Patient said she has been without and has been requesting this since June.  Please call patient to confirm at [phone]

## 2020-05-30 MED ORDER — ONETOUCH VERIO VI STRP: ORAL_STRIP | 2 refills | Status: AC

## 2020-05-30 NOTE — Telephone Encounter (Signed)
Called pt and informed her that the script was sent with the DX code and pt stated understanding.

## 2020-06-03 ENCOUNTER — Other Ambulatory Visit: Payer: Self-pay | Admitting: Family Medicine

## 2020-06-03 DIAGNOSIS — N183 Chronic kidney disease, stage 3 unspecified: Secondary | ICD-10-CM

## 2020-06-03 DIAGNOSIS — I10 Essential (primary) hypertension: Secondary | ICD-10-CM

## 2020-06-03 NOTE — Telephone Encounter (Signed)
Requested Prescriptions  Pending Prescriptions Disp Refills  . metFORMIN (GLUCOPHAGE-XR) 750 MG 24 hr tablet [Pharmacy Med Name: METFORMIN HCL ER 750 MG TABLET] 30 tablet 0    Sig: TAKE 1 TABLET BY MOUTH EVERY DAY WITH BREAKFAST     Endocrinology:  Diabetes - Biguanides Failed - 06/03/2020 12:42 AM      Failed - Cr in normal range and within 360 days    Creat  Date Value Ref Range Status  01/01/2015 1.01 0.50 - 1.10 mg/dL Final   Creatinine, Ser  Date Value Ref Range Status  05/16/2020 1.30 (H) 0.57 - 1.00 mg/dL Final         Failed - eGFR in normal range and within 360 days    GFR, Est African American  Date Value Ref Range Status  05/29/2014 69 mL/min Final   GFR calc Af Amer  Date Value Ref Range Status  05/16/2020 46 (L) >59 mL/min/1.73 Final    Comment:    **[facility] currently reports eGFR in compliance with the current**   recommendations of the [employer]. [facility] will   update reporting as new guidelines are published from the NKF-ASN   Task force.    GFR, Est Non African American  Date Value Ref Range Status  05/29/2014 60 mL/min Final    Comment:      The estimated GFR is a calculation valid for adults (>=23 years old) that uses the CKD-EPI algorithm to adjust for age and sex. It is   not to be used for children, pregnant women, hospitalized patients,    patients on dialysis, or with rapidly changing kidney function. According to the NKDEP, eGFR >89 is normal, 60-89 shows mild impairment, 30-59 shows moderate impairment, 15-29 shows severe impairment and <15 is ESRD.     GFR calc non Af Amer  Date Value Ref Range Status  05/16/2020 40 (L) >59 mL/min/1.73 Final         Passed - HBA1C is between 0 and 7.9 and within 180 days    Hgb A1c MFr Bld  Date Value Ref Range Status  05/16/2020 6.6 (H) 4.8 - 5.6 % Final    Comment:             Prediabetes: 5.7 - 6.4          Diabetes: >6.4          Glycemic control for adults with diabetes: <7.0           Passed - Valid encounter within last 6 months    Recent Outpatient Visits          2 weeks ago Type 2 diabetes mellitus with stage 3 chronic kidney disease, without long-term current use of insulin, unspecified whether stage 3a or 3b CKD ([place])   [facility], Lilia Argue, MD   7 months ago Grief at loss of child   [facility], Lilia Argue, MD   1 year ago Type 2 diabetes mellitus with stage 3 chronic kidney disease, without long-term current use of insulin [facility])   [facility] at [facility], Lilia Argue, MD   1 year ago Type 2 diabetes mellitus without complication, without long-term current use of insulin [facility])   Primary Care at [facility], Zoe A, MD   1 year ago Type 2 diabetes mellitus without complication, without long-term current use of insulin [facility])   [facility] at [facility], Lilia Argue, MD             .  metoprolol succinate (TOPROL-XL) 50 MG 24 hr tablet [Pharmacy Med Name: METOPROLOL SUCC ER 50 MG TAB] 30 tablet 8    Sig: TAKE 1 TABLET BY MOUTH EVERY DAY     Cardiovascular:  Beta Blockers Passed - 06/03/2020 12:42 AM      Passed - Last BP in normal range    BP Readings from Last 1 Encounters:  05/16/20 130/68         Passed - Last Heart Rate in normal range    Pulse Readings from Last 1 Encounters:  05/16/20 71         Passed - Valid encounter within last 6 months    Recent Outpatient Visits          2 weeks ago Type 2 diabetes mellitus with stage 3 chronic kidney disease, without long-term current use of insulin, unspecified whether stage 3a or 3b CKD ([place])   [facility], Lilia Argue, MD   7 months ago Grief at loss of child   [facility], Lilia Argue, MD   1 year ago Type 2 diabetes mellitus with stage 3 chronic kidney disease, without long-term current use of insulin [facility])   [facility], Lilia Argue, MD   1 year ago Type 2 diabetes mellitus without complication, without  long-term current use of insulin [facility])   Primary Care at [facility], [place] A, MD   1 year ago Type 2 diabetes mellitus without complication, without long-term current use of insulin [facility])   [facility] at [facility], Lilia Argue, MD

## 2020-07-03 ENCOUNTER — Other Ambulatory Visit: Payer: Self-pay | Admitting: Family Medicine

## 2020-07-03 DIAGNOSIS — I10 Essential (primary) hypertension: Secondary | ICD-10-CM

## 2020-07-04 ENCOUNTER — Other Ambulatory Visit: Payer: Self-pay | Admitting: Family Medicine

## 2020-07-04 DIAGNOSIS — I10 Essential (primary) hypertension: Secondary | ICD-10-CM

## 2020-07-29 LAB — HM MAMMOGRAPHY

## 2020-08-04 ENCOUNTER — Other Ambulatory Visit: Payer: Self-pay | Admitting: Family Medicine

## 2020-08-04 DIAGNOSIS — I10 Essential (primary) hypertension: Secondary | ICD-10-CM

## 2020-08-04 MED ORDER — LISINOPRIL 20 MG PO TABS: 20.0000 mg | ORAL_TABLET | Freq: Every day | ORAL | 0 refills | Status: DC

## 2020-08-04 NOTE — Telephone Encounter (Signed)
Medication Refill - Medication: lisinopril (ZESTRIL) 20 MG tablet    Has the patient contacted their pharmacy? yes (Agent: If no, request that the patient contact the pharmacy for the refill.) (Agent: If yes, when and what did the pharmacy advise?)Contact PCP  Preferred Pharmacy (with phone number or street name):  [facility] [phone] [place], [place] - [address] AT [facility] Phone:  [phone]  Fax:  [phone]       Agent: Please be advised that RX refills may take up to 3 business days. We ask that you follow-up with your pharmacy.

## 2020-08-04 NOTE — Telephone Encounter (Signed)
E-Prescribing Status: Verification status not available for this order  Patient has seen Dr Mitchel Honour in the past- with note to schedule appointment

## 2020-08-28 ENCOUNTER — Other Ambulatory Visit: Payer: Self-pay | Admitting: Emergency Medicine

## 2020-08-28 DIAGNOSIS — I10 Essential (primary) hypertension: Secondary | ICD-10-CM

## 2020-09-04 ENCOUNTER — Ambulatory Visit (INDEPENDENT_AMBULATORY_CARE_PROVIDER_SITE_OTHER): Payer: Medicare Other | Admitting: Family Medicine

## 2020-09-04 ENCOUNTER — Other Ambulatory Visit: Payer: Self-pay

## 2020-09-04 ENCOUNTER — Encounter: Payer: Self-pay | Admitting: Family Medicine

## 2020-09-04 ENCOUNTER — Ambulatory Visit (INDEPENDENT_AMBULATORY_CARE_PROVIDER_SITE_OTHER): Payer: Medicare Other

## 2020-09-04 VITALS — BP 138/81 | HR 82 | Temp 97.7°F | Resp 16 | Ht 62.0 in | Wt 197.4 lb

## 2020-09-04 DIAGNOSIS — M7121 Synovial cyst of popliteal space [Baker], right knee: Secondary | ICD-10-CM

## 2020-09-04 DIAGNOSIS — S8991XA Unspecified injury of right lower leg, initial encounter: Secondary | ICD-10-CM

## 2020-09-04 NOTE — Progress Notes (Signed)
Patient ID: Erica Thomas, female    DOB: 02-15-1946  Age: 74 y.o. MRN: [id number]  Chief Complaint  Patient presents with  . Knee Injury    pt twisted Rt knee 07/29/2020 stepped wrong and got a popping sound tried nursing at home but still has pain and some instability on that knee.     Subjective:  A couple weeks ago this patient twisted her knee and felt a popping.  Is persisted with hurting in the lateral aspect of the right popliteal fossa.  However since she made her appointment a couple days to go to come in it is started improving and it is not hurting today.  Current allergies, medications, problem list, past/family and social histories reviewed.  Objective:  BP 138/81   Pulse 82   Temp 97.7 F (36.5 C) (Temporal)   Resp 16   Ht 5\' 2"  (1.575 m)   Wt 197 lb 6.4 oz (89.5 kg)   SpO2 98%   BMI 36.10 kg/m   Mild tenderness in the lateral aspect of the right popliteal fossa, feels like she may have a small Baker's cyst in that area.  Joint otherwise seems intact without effusion.  She has a little lipoma or cyst in the left elbow near the ulna.  Also smaller on the right elbow.  Assessment & Plan:   Assessment: 1. Knee injury, right, initial encounter   2. Baker's cyst of knee, right       Plan: See instructions  Orders Placed This Encounter  Procedures  . DG Knee Complete 4 Views Right    Order Specific Question:   Reason for Exam (SYMPTOM  OR DIAGNOSIS REQUIRED)    Answer:   Injury of Right Knee    Order Specific Question:   Preferred imaging location?    Answer:   Internal    No orders of the defined types were placed in this encounter.        Patient Instructions    I am glad that you are doing better today.  This possibly represents a Baker's cyst behind the knee that got inflamed, or it could just be a strained tendon or ligament there.  Consider using some diclofenac gel (Voltaren gel) 2 or 3 times a day on the knee if necessary.  The Tylenol  you take can be taken a maximum of 1000 mg 3 times daily (2x500).  This gives pain relief but does not have an anti-inflammatory effect  If you can tolerate them, you can take either Aleve 220 mg 2 pills twice daily or ibuprofen 200 mg 3 pills twice daily with food as needed for pain and inflammation.  If you continue having further problems I think we will refer you on to sports medicine, probably Vickki Hearing at [facility] or Rhina Brackett at [place].  Call back or return as needed   If you have lab work done today you will be contacted with your lab results within the next 2 weeks.  If you have not heard from [place] then please contact us. The fastest way to get your results is to register for My Chart.   IF you received an x-ray today, you will receive an invoice from [facility]. Please contact [facility] at [phone] with questions or concerns regarding your invoice.   IF you received labwork today, you will receive an invoice from [place]. Please contact [facility] at [phone] with questions or concerns regarding your invoice.   Our billing staff will  not be able to assist you with questions regarding bills from these companies.  You will be contacted with the lab results as soon as they are available. The fastest way to get your results is to activate your My Chart account. Instructions are located on the last page of this paperwork. If you have not heard from [place] regarding the results in 2 weeks, please contact this office.        Return if symptoms worsen or fail to improve.   Ruben Reason, MD 09/04/2020

## 2020-09-04 NOTE — Patient Instructions (Addendum)
  I am glad that you are doing better today.  This possibly represents a Baker's cyst behind the knee that got inflamed, or it could just be a strained tendon or ligament there.  Consider using some diclofenac gel (Voltaren gel) 2 or 3 times a day on the knee if necessary.  The Tylenol you take can be taken a maximum of 1000 mg 3 times daily (2x500).  This gives pain relief but does not have an anti-inflammatory effect  If you can tolerate them, you can take either Aleve 220 mg 2 pills twice daily or ibuprofen 200 mg 3 pills twice daily with food as needed for pain and inflammation.  If you continue having further problems I think we will refer you on to sports medicine, probably Vickki Hearing at [facility] or Rhina Brackett at [place].  Call back or return as needed   If you have lab work done today you will be contacted with your lab results within the next 2 weeks.  If you have not heard from [place] then please contact us. The fastest way to get your results is to register for My Chart.   IF you received an x-ray today, you will receive an invoice from [facility]. Please contact [facility] at [phone] with questions or concerns regarding your invoice.   IF you received labwork today, you will receive an invoice from [place]. Please contact [facility] at [phone] with questions or concerns regarding your invoice.   Our billing staff will not be able to assist you with questions regarding bills from these companies.  You will be contacted with the lab results as soon as they are available. The fastest way to get your results is to activate your My Chart account. Instructions are located on the last page of this paperwork. If you have not heard from [place] regarding the results in 2 weeks, please contact this office.

## 2020-09-23 ENCOUNTER — Other Ambulatory Visit: Payer: Self-pay | Admitting: Emergency Medicine

## 2020-09-23 DIAGNOSIS — I10 Essential (primary) hypertension: Secondary | ICD-10-CM

## 2020-10-06 ENCOUNTER — Other Ambulatory Visit: Payer: Self-pay

## 2020-10-06 ENCOUNTER — Telehealth: Payer: Self-pay | Admitting: General Practice

## 2020-10-06 DIAGNOSIS — E785 Hyperlipidemia, unspecified: Secondary | ICD-10-CM

## 2020-10-06 MED ORDER — ATORVASTATIN CALCIUM 40 MG PO TABS: 40.0000 mg | ORAL_TABLET | Freq: Every day | ORAL | 3 refills | Status: AC

## 2020-10-06 NOTE — Telephone Encounter (Signed)
Pt called and stated that she is needing a refill on Rx listed below until her Toc appt in March. Pt has enough for January just not Feb. Please advise.    atorvastatin (LIPITOR) 40 MG tablet [[id number]]   [facility] [place], [place] - [address] AT Portal to Registered [employer] Sites

## 2020-11-27 ENCOUNTER — Other Ambulatory Visit: Payer: Self-pay | Admitting: Emergency Medicine

## 2020-11-27 DIAGNOSIS — I10 Essential (primary) hypertension: Secondary | ICD-10-CM

## 2020-12-02 ENCOUNTER — Other Ambulatory Visit: Payer: Self-pay

## 2020-12-02 ENCOUNTER — Ambulatory Visit (INDEPENDENT_AMBULATORY_CARE_PROVIDER_SITE_OTHER): Payer: Medicare Other | Admitting: Family Medicine

## 2020-12-02 ENCOUNTER — Encounter: Payer: Self-pay | Admitting: Family Medicine

## 2020-12-02 VITALS — BP 136/74 | HR 77 | Temp 98.0°F | Ht 62.0 in | Wt 196.0 lb

## 2020-12-02 DIAGNOSIS — D649 Anemia, unspecified: Secondary | ICD-10-CM | POA: Diagnosis not present

## 2020-12-02 DIAGNOSIS — E785 Hyperlipidemia, unspecified: Secondary | ICD-10-CM | POA: Diagnosis not present

## 2020-12-02 DIAGNOSIS — N183 Chronic kidney disease, stage 3 unspecified: Secondary | ICD-10-CM | POA: Diagnosis not present

## 2020-12-02 DIAGNOSIS — E559 Vitamin D deficiency, unspecified: Secondary | ICD-10-CM

## 2020-12-02 DIAGNOSIS — K219 Gastro-esophageal reflux disease without esophagitis: Secondary | ICD-10-CM

## 2020-12-02 DIAGNOSIS — I10 Essential (primary) hypertension: Secondary | ICD-10-CM

## 2020-12-02 DIAGNOSIS — E1122 Type 2 diabetes mellitus with diabetic chronic kidney disease: Secondary | ICD-10-CM

## 2020-12-02 MED ORDER — FAMOTIDINE 20 MG PO TABS: 20.0000 mg | ORAL_TABLET | Freq: Two times a day (BID) | ORAL | 1 refills | Status: AC | PRN

## 2020-12-02 MED ORDER — FAMOTIDINE 20 MG PO TABS: 20.0000 mg | ORAL_TABLET | Freq: Two times a day (BID) | ORAL | 1 refills | Status: DC | PRN

## 2020-12-02 NOTE — Patient Instructions (Addendum)
  Health Maintenance After Age 77 After age 77, you are at a higher risk for certain long-term diseases and infections as well as injuries from falls. Falls are a major cause of broken bones and head injuries in people who are older than age 77. Getting regular preventive care can help to keep you healthy and well. Preventive care includes getting regular testing and making lifestyle changes as recommended by your health care provider. Talk with your health care provider about:  Which screenings and tests you should have. A screening is a test that checks for a disease when you have no symptoms.  A diet and exercise plan that is right for you. What should I know about screenings and tests to prevent falls? Screening and testing are the best ways to find a health problem early. Early diagnosis and treatment give you the best chance of managing medical conditions that are common after age 77. Certain conditions and lifestyle choices may make you more likely to have a fall. Your health care provider may recommend:  Regular vision checks. Poor vision and conditions such as cataracts can make you more likely to have a fall. If you wear glasses, make sure to get your prescription updated if your vision changes.  Medicine review. Work with your health care provider to regularly review all of the medicines you are taking, including over-the-counter medicines. Ask your health care provider about any side effects that may make you more likely to have a fall. Tell your health care provider if any medicines that you take make you feel dizzy or sleepy.  Osteoporosis screening. Osteoporosis is a condition that causes the bones to get weaker. This can make the bones weak and cause them to break more easily.  Blood pressure screening. Blood pressure changes and medicines to control blood pressure can make you feel dizzy.  Strength and balance checks. Your health care provider may recommend certain tests to  check your strength and balance while standing, walking, or changing positions.  Foot health exam. Foot pain and numbness, as well as not wearing proper footwear, can make you more likely to have a fall.  Depression screening. You may be more likely to have a fall if you have a fear of falling, feel emotionally low, or feel unable to do activities that you used to do.  Alcohol use screening. Using too much alcohol can affect your balance and may make you more likely to have a fall. What actions can I take to lower my risk of falls? General instructions  Talk with your health care provider about your risks for falling. Tell your health care provider if: ? You fall. Be sure to tell your health care provider about all falls, even ones that seem minor. ? You feel dizzy, sleepy, or off-balance.  Take over-the-counter and prescription medicines only as told by your health care provider. These include any supplements.  Eat a healthy diet and maintain a healthy weight. A healthy diet includes low-fat dairy products, low-fat (lean) meats, and fiber from whole grains, beans, and lots of fruits and vegetables. Home safety  Remove any tripping hazards, such as rugs, cords, and clutter.  Install safety equipment such as grab bars in bathrooms and safety rails on stairs.  Keep rooms and walkways well-lit. Activity  Follow a regular exercise program to stay fit. This will help you maintain your balance. Ask your health care provider what types of exercise are appropriate for you.  If you need a cane   or walker, use it as recommended by your health care provider.  Wear supportive shoes that have nonskid soles.   Lifestyle  Do not drink alcohol if your health care provider tells you not to drink.  If you drink alcohol, limit how much you have: ? 0-1 drink a day for women. ? 0-2 drinks a day for men.  Be aware of how much alcohol is in your drink. In the [place] one drink equals one typical bottle  of beer (12 oz), one-half glass of wine (5 oz), or one shot of hard liquor (1 oz).  Do not use any products that contain nicotine or tobacco, such as cigarettes and e-cigarettes. If you need help quitting, ask your health care provider. Summary  Having a healthy lifestyle and getting preventive care can help to protect your health and wellness after age 77.  Screening and testing are the best way to find a health problem early and help you avoid having a fall. Early diagnosis and treatment give you the best chance for managing medical conditions that are more common for people who are older than age 77.  Falls are a major cause of broken bones and head injuries in people who are older than age 77. Take precautions to prevent a fall at home.  Work with your health care provider to learn what changes you can make to improve your health and wellness and to prevent falls. This information is not intended to replace advice given to you by your health care provider. Make sure you discuss any questions you have with your health care provider. Document Revised: 01/11/2019 Document Reviewed: 08/03/2017 Elsevier Patient Education  2021 Elsevier Inc.   If you have lab work done today you will be contacted with your lab results within the next 2 weeks.  If you have not heard from us then please contact us. The fastest way to get your results is to register for My Chart.   IF you received an x-ray today, you will receive an invoice from [facility]. Please contact [facility] at [phone] with questions or concerns regarding your invoice.   IF you received labwork today, you will receive an invoice from [facility]. Please contact [facility] at [phone] with questions or concerns regarding your invoice.   Our billing staff will not be able to assist you with questions regarding bills from these companies.  You will be contacted with the lab results as soon as they are available.  The fastest way to get your results is to activate your My Chart account. Instructions are located on the last page of this paperwork. If you have not heard from us regarding the results in 2 weeks, please contact this office.      

## 2020-12-02 NOTE — Progress Notes (Signed)
3/1/20223:39 PM  Erica Thomas 1946-05-06, 75 y.o., female [id number]  Chief Complaint  Patient presents with  . Leg Swelling    Both legs wears comp stockings during day - R ankle has pain   . Gastroesophageal Reflux    Cough and phlegm after meal   . Diabetes  . Hypertension    HPI:   Patient is a 75 y.o. female with past medical history significant for DM2, HTN, CKD3,goutanemia from ulcer and bleeding AVMwho presents today for TOC.  Patient Care Team: Cynda Soule, Laurita Quint, FNP as PCP - General (Family Medicine) Adrian Prows, MD as Consulting Physician (Cardiology) Rosita Fire, MD as Consulting Physician (Nephrology)  This year had pain in her right knee Had injections into her right knee Hurt knee leaving mammogram in Oct In Dec saw Dr. Linna Darner Used Voltaren gel, this helped at the time  Diabetes Metformin 750 daily This medication does give her diarrhea Had issues with sulfonylurea in the past BG in the AM 122-147 Lab Results  Component Value Date   HGBA1C 6.6 (H) 05/16/2020   Vitamin D Deficiency 4000 IU OTC vitamin D Last vitamin D Lab Results  Component Value Date   VD25OH 44 02/08/2013     HLD Atorvastatin 40 mg Lab Results  Component Value Date   CHOL 172 05/16/2020   HDL 55 05/16/2020   LDLCALC 95 05/16/2020   TRIG 127 05/16/2020   CHOLHDL 3.1 05/16/2020    HTN Metoprolol 50 Lisinopril 20 Furosemide 40 Home readings 141-128/71-90 BP Readings from Last 3 Encounters:  12/02/20 136/74  09/04/20 138/81  05/16/20 130/68   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 HEMOGLOBIN A1C  11/16/2020  . DEXA SCAN  09/04/2021 (Originally 05/10/2009)  . TETANUS/TDAP  09/04/2021 (Originally 10/12/2016)  . OPHTHALMOLOGY EXAM  01/27/2021  . FOOT EXAM  09/04/2021  . INFLUENZA VACCINE  Completed  . COVID-19 Vaccine  Completed  . Hepatitis C Screening  Completed  . HPV VACCINES  Aged Out     Depression screen [facility] 2/9 12/02/2020 09/04/2020 05/16/2020  Decreased Interest 0 0 0  Down, Depressed, Hopeless 0 0 0  PHQ - 2 Score 0 0 0    Fall Risk  12/02/2020 09/04/2020 05/16/2020 05/31/2019 02/07/2019  Falls in the past year? 0 0 0 0 0  Number falls in past yr: 0 0 0 0 0  Injury with Fall? 0 0 0 0 0  Comment - - - - -  Risk for fall due to : - No Fall Risks - - -  Follow up Falls evaluation completed Falls evaluation completed Falls evaluation completed - -     Allergies  Allergen Reactions  . Amlodipine Swelling  . Penicillins Hives    Childhood reaction Has patient had a PCN reaction causing immediate rash, facial/tongue/throat swelling,  SOB or lightheadedness with hypotension: Yes Has patient had a PCN reaction causing severe rash involving mucus membranes or skin necrosis: No Has patient had a PCN reaction that required hospitalization: No Has patient had a PCN reaction occurring within the last 10 years: No If all of the above answers are "NO", then may proceed with Cephalosporin use.  . Sulfonylureas     hypoglycemia    Prior to Admission medications   Medication Sig Start Date End Date Taking? Authorizing Provider  acetaminophen (TYLENOL) 500 MG tablet Take 500 mg by mouth every 6 (six) hours as needed.   Yes [provider]  allopurinol (ZYLOPRIM) 100 MG tablet Take 100 mg by mouth daily before supper.  02/13/18  Yes [provider]  atorvastatin  (LIPITOR) 40 MG tablet Take 1 tablet (40 mg total) by mouth daily. 10/06/20  Yes Maximiano Coss, NP  blood glucose meter kit and supplies KIT Check fasting blood sugar once daily. Use diagnosis code of E11.9. 03/08/18  Yes [name], PA-C  Cholecalciferol (VITAMIN D) 50 MCG (2000 UT) CAPS Take by mouth. 2 daily   Yes [provider]  ferrous sulfate 325 (65 FE) MG tablet Take 325 mg by mouth daily with breakfast. 2 daily   Yes [provider]  furosemide (LASIX) 40 MG tablet Take 40 mg by mouth daily.   Yes [provider]  glucose blood (ONETOUCH VERIO) test strip USE ONCE DAILY AS DIRECTED DX CODE: E 11.22 05/30/20  Yes Jacelyn Pi, Lilia Argue, MD  Lancets (ONETOUCH DELICA PLUS [id number]) [place] USE AS DIRECTED 08/20/19  Yes Jacelyn Pi, Lilia Argue, MD  lisinopril (ZESTRIL) 20 MG tablet TAKE 1 TABLET BY MOUTH EVERY DAY *NEED TO ESTABLISH CARE WITH ALTERNATE PROVIDER. CALL FOR APPT* 11/27/20  Yes Tamika Nou, Laurita Quint, FNP  metFORMIN (GLUCOPHAGE-XR) 750 MG 24 hr tablet TAKE 1 TABLET BY MOUTH EVERY DAY WITH BREAKFAST 06/03/20  Yes Jacelyn Pi, Lilia Argue, MD  metoprolol succinate (TOPROL-XL) 50 MG 24 hr tablet TAKE 1 TABLET BY MOUTH EVERY DAY 06/03/20  Yes Jacelyn Pi, Lilia Argue, MD    Past Medical History:  Diagnosis Date  . Arthritis   . Blood transfusion without reported diagnosis   . Cataract   . Chronic kidney disease    Phreesia 11/29/2020  . Diabetes mellitus without complication ([place])    control by diet only  . GERD (gastroesophageal reflux disease)    Phreesia 11/29/2020  . Hyperlipidemia   . Hypertension     Past Surgical History:  Procedure Laterality Date  . ABDOMINAL HYSTERECTOMY  1974   Class V PAP  . APPENDECTOMY    . BIOPSY  08/09/2018   Procedure: BIOPSY;  Surgeon: Ronnette Juniper, MD;  Location: [place];  Service: Gastroenterology;;  . COLONOSCOPY    . COLONOSCOPY WITH PROPOFOL N/A 08/09/2018   Procedure: COLONOSCOPY WITH PROPOFOL;  Surgeon: Ronnette Juniper, MD;   Location: [place];  Service: Gastroenterology;  Laterality: N/A;  . ESOPHAGOGASTRODUODENOSCOPY (EGD) WITH PROPOFOL N/A 08/09/2018   Procedure: ESOPHAGOGASTRODUODENOSCOPY (EGD) WITH PROPOFOL;  Surgeon: Ronnette Juniper, MD;  Location: [place];  Service: Gastroenterology;  Laterality: N/A;  . EYE SURGERY N/A    Phreesia 11/29/2020  . HOT HEMOSTASIS N/A 08/09/2018   Procedure: HOT HEMOSTASIS (ARGON PLASMA COAGULATION/BICAP);  Surgeon: Ronnette Juniper, MD;  Location: [place];  Service: Gastroenterology;  Laterality: N/A;  . SPINAL CORD DECOMPRESSION  03/28/2013   L5 S1   Dr Rolena Infante  . SPINE SURGERY N/A  Phreesia 11/29/2020  . TONSILLECTOMY      Social History   Tobacco Use  . Smoking status: Never Smoker  . Smokeless tobacco: Never Used  Substance Use Topics  . Alcohol use: No    Family History  Problem Relation Age of Onset  . Stroke Mother   . Diabetes Mother   . Heart disease Mother   . Hyperlipidemia Mother   . Heart disease Father   . Cancer Sister        Ovarian and stomach cancer  . Diabetes Maternal Grandmother   . Heart disease Maternal Grandmother   . Cancer Maternal Grandfather   . Arthritis Paternal Grandmother     Review of Systems  Constitutional: Negative for chills, fever and malaise/fatigue.  Eyes: Negative for blurred vision and double vision.  Respiratory: Negative for cough, shortness of breath and wheezing.   Cardiovascular: Positive for leg swelling. Negative for chest pain and palpitations.  Gastrointestinal: Positive for heartburn. Negative for abdominal pain, blood in stool, constipation, diarrhea, nausea and vomiting.  Genitourinary: Negative for dysuria, frequency and hematuria.  Musculoskeletal: Negative for back pain and joint pain.  Skin: Negative for rash.  Neurological: Negative for dizziness, weakness and headaches.     OBJECTIVE:  Today's Vitals   12/02/20 1504  BP: 136/74  Pulse: 77  Temp: 98 F (36.7 C)  SpO2: 97%   Weight: 196 lb (88.9 kg)  Height: _0  (1.575 m)   Body mass index is 35.85 kg/m.   Physical Exam Constitutional:      General: She is not in acute distress.    Appearance: Normal appearance. She is not ill-appearing.  HENT:     Head: Normocephalic.  Cardiovascular:     Rate and Rhythm: Normal rate and regular rhythm.     Pulses: Normal pulses.     Heart sounds: Normal heart sounds. No murmur heard. No friction rub. No gallop.   Pulmonary:     Effort: Pulmonary effort is normal. No respiratory distress.     Breath sounds: Normal breath sounds. No stridor. No wheezing, rhonchi or rales.  Abdominal:     General: Bowel sounds are normal.     Palpations: Abdomen is soft.     Tenderness: There is no abdominal tenderness.  Musculoskeletal:     Right lower leg: Edema present.     Left lower leg: Edema present.  Skin:    General: Skin is warm and dry.  Neurological:     Mental Status: She is alert and oriented to person, place, and time.  Psychiatric:        Mood and Affect: Mood normal.        Behavior: Behavior normal.     No results found for this or any previous visit (from the past 24 hour(s)).  No results found.   ASSESSMENT and PLAN  Problem List Items Addressed This Visit      Endocrine   Diabetes ([place]) - Primary   Relevant Orders   Hemoglobin A1c     Genitourinary   CKD (chronic kidney disease), stage III (HCC)     Other   Hyperlipidemia   Relevant Orders   Lipid Panel   Symptomatic anemia   Relevant Medications   ferrous sulfate 325 (65 FE) MG tablet   Other Relevant Orders   Iron, TIBC and Ferritin Panel    Other Visit Diagnoses    Vitamin D deficiency       Relevant Orders   Vitamin D, 25-hydroxy  Essential hypertension       Relevant Orders   CMP14+EGFR   Gastroesophageal reflux disease without esophagitis       Relevant Medications   famotidine (PEPCID) 20 MG tablet      Plan . Will follow up with lab results . Famotidine as  needed . Chronic conditions stable on current regimens    Return in about 3 months (around 03/04/2021).    Huston Foley Kidus Delman, FNP-BC [facility] [place], [place] [address] Ph.  [phone] Fax [phone]

## 2020-12-03 ENCOUNTER — Other Ambulatory Visit: Payer: Self-pay | Admitting: Family Medicine

## 2020-12-03 DIAGNOSIS — E1122 Type 2 diabetes mellitus with diabetic chronic kidney disease: Secondary | ICD-10-CM

## 2020-12-03 DIAGNOSIS — N1832 Chronic kidney disease, stage 3b: Secondary | ICD-10-CM

## 2020-12-03 LAB — CMP14+EGFR
ALT: 12 IU/L (ref 0–32)
AST: 13 IU/L (ref 0–40)
Albumin/Globulin Ratio: 2.2 (ref 1.2–2.2)
Albumin: 4.2 g/dL (ref 3.7–4.7)
Alkaline Phosphatase: 153 IU/L — ABNORMAL HIGH (ref 44–121)
BUN/Creatinine Ratio: 23 (ref 12–28)
BUN: 30 mg/dL — ABNORMAL HIGH (ref 8–27)
Bilirubin Total: 0.2 mg/dL (ref 0.0–1.2)
CO2: 21 mmol/L (ref 20–29)
Calcium: 9.6 mg/dL (ref 8.7–10.3)
Chloride: 103 mmol/L (ref 96–106)
Creatinine, Ser: 1.29 mg/dL — ABNORMAL HIGH (ref 0.57–1.00)
Globulin, Total: 1.9 g/dL (ref 1.5–4.5)
Glucose: 184 mg/dL — ABNORMAL HIGH (ref 65–99)
Potassium: 4.4 mmol/L (ref 3.5–5.2)
Sodium: 140 mmol/L (ref 134–144)
Total Protein: 6.1 g/dL (ref 6.0–8.5)
eGFR: 43 mL/min/{1.73_m2} — ABNORMAL LOW (ref >59)

## 2020-12-03 LAB — LIPID PANEL
Chol/HDL Ratio: 3.2 ratio (ref 0.0–4.4)
Cholesterol, Total: 148 mg/dL (ref 100–199)
HDL: 46 mg/dL (ref >39)
LDL Chol Calc (NIH): 67 mg/dL (ref 0–99)
Triglycerides: 213 mg/dL — ABNORMAL HIGH (ref 0–149)
VLDL Cholesterol Cal: 35 mg/dL (ref 5–40)

## 2020-12-03 LAB — HEMOGLOBIN A1C
Est. average glucose Bld gHb Est-mCnc: 157 mg/dL
Hgb A1c MFr Bld: 7.1 % — ABNORMAL HIGH (ref 4.8–5.6)

## 2020-12-03 LAB — IRON,TIBC AND FERRITIN PANEL
Ferritin: 127 ng/mL (ref 15–150)
Iron Saturation: 18 % (ref 15–55)
Iron: 51 ug/dL (ref 27–139)
Total Iron Binding Capacity: 276 ug/dL (ref 250–450)
UIBC: 225 ug/dL (ref 118–369)

## 2020-12-03 LAB — VITAMIN D 25 HYDROXY (VIT D DEFICIENCY, FRACTURES): Vit D, 25-Hydroxy: 34.1 ng/mL (ref 30.0–100.0)

## 2020-12-03 MED ORDER — DAPAGLIFLOZIN PROPANEDIOL 5 MG PO TABS: 5.0000 mg | ORAL_TABLET | Freq: Every day | ORAL | 3 refills | Status: AC

## 2020-12-23 ENCOUNTER — Other Ambulatory Visit: Payer: Self-pay | Admitting: Family Medicine

## 2020-12-23 DIAGNOSIS — I10 Essential (primary) hypertension: Secondary | ICD-10-CM

## 2020-12-23 MED ORDER — METOPROLOL SUCCINATE ER 50 MG PO TB24: 50.0000 mg | ORAL_TABLET | Freq: Every day | ORAL | 0 refills | Status: AC

## 2020-12-23 NOTE — Telephone Encounter (Signed)
Medication Refill - Medication: metoprolol succinate (TOPROL-XL) 50 MG 24 hr tablet     Preferred Pharmacy (with phone number or street name):  [id number] [place], [place] - [address] AT [employer] Phone:  [phone]  Fax:  [phone]      Significant History/Details    Smoking Never Smoker Smokeless Tobacco Never Used Vaping Never used Alcohol No Preferred Language English   Last 4 Visits    Today    This Visit: Telephone with Internal Med - Just, K  Medication Refill   Mar 02    Orders Only with Fam Med - Just, K  Type 2 diabetes mellitus with stage 3b chronic kidney disease, without long-term current use of insulin ([place]) (Primary Dx)   Mar 01    Office Visit with Fam Med - Just, K  Type 2 diabetes mellitus with stage 3 chronic kidney disease, without long-term current use of insulin, unspecified whether stage 3a or 3b CKD ([place]) (Primary Dx); Hyperlipidemia, unspecified hyperlipidemia type; Symptomatic anemia; Vitamin D deficiency; Essential hypertension; Stage 3 chronic kidney disease, unspecified whether stage 3a or 3b CKD ([place]); Gastroesophageal reflux disease without esophagitis   Mar 01    Travel   Jan 03    Orders Only with Fam Med - [place], M  Hyperlipidemia, unspecified hyperlipidemia type    Immunizations/Injections   Fluad Quad(high Dose 65+)05/31/2019  Influenza Inj Mdck Quad Pf12/17/2018  Influenza Split10/23/2014  Influenza, High Dose Seasonal PF8/27/2021  Influenza, Seasonal, Injecte, Preservative Fre12/19/2013  Influenza,inj,Quad PF,6+ Mos10/03/2018  Influenza-Unspecified10/03/2018  PFIZER Comirnaty(Gray Top)Covid-19 Tri-Sucrose Vaccine9/29/2021, 11/15/2019, 10/17/2019  Pneumococcal Conjugate-1310/23/2014  Td1/06/2007  Zoster1/06/2008   Health Maintenance  09/04/2021 DEXA SCAN 09/04/2021 TETANUS/TDAP  01/27/2021 OPHTHALMOLOGY EXAM  06/04/2021 HEMOGLOBIN A1C   09/04/2021 FOOT EXAM  Vitals from encounters over the past 365 days   12/02/20 09/04/20 05/16/20  Vitals  BP 136/74 138/81 130/68  Temp 98 F (36.7 C) 97.7 F (36.5 C) 98 F (36.7 C)  Temp Source -- Temporal Temporal  Pulse Rate 77 82 71  Resp -- 16 --  SpO2 97 % 98 % 99 %  Height 5\' 2"  (1.575 m) 5\' 2"  (1.575 m) 5\' 2"  (1.575 m)  Weight 196 lb (88.9 kg) 197 lb 6.4 oz (89.5 kg) 192 lb 12.8 oz (87.5 kg)   Recent Imaging Studies (Last 10 results in 10 years)    09/04/20 1525 DG Knee Complete 4 Views Right View Image  08/08/18 0853 DG CHEST PORT 1 VIEW View Image  02/20/18 1634 US RENAL View Image  10/03/17 1743 DG Chest 2 View View Image  04/30/15 1144 MM Outside Films Mammo [facility], MM Diagnostic Unilateral Digital w/cad left  04/30/15 1142 [place] OUTSIDE FILMS BREAST [facility], US Breast, Limited Unilateral Left  12/17/14 1459 MM Digital Diagnostic Unilat L [facility]  06/18/14 1122 US BREAST LTD UNI LEFT INC AXILLA [facility]  03/29/13 9629 DG Lumbar Spine 2-3 Views View Image  03/28/13 1720 DG C-Arm GT 120 Min         Recent ED/Admissions   Maximum visits displayed: 10 Date Type Provider Description  08/07/2018 Admission  Symptomatic anemia (Primary Dx); DOE (dyspnea on exertion);...  02/20/2018 Hospital Visit  CKD (chronic kidney disease) stage 3, GFR 30-59 ml/min (HCC)...  03/26/2013 Hospital Visit    03/16/2013 Admission    02/16/2013 Hospital Visit  DDD (degenerative disc disease), lumbar  09/28/2012 Hospital Visit  Limb pain   Chief Complaint   Medication Refill  Allergies     AmlodipineSwelling   PenicillinsHives   Sulfonylureas    Lipids (Last 6 results in 3 years)     Chol HDL LDL VLDL TG  12/02/20 1542 -- 46 -- -- 213Important  05/16/20 1621 -- 55 -- -- 127  05/31/19 1647 -- 47 -- -- 163Important  03/13/19 0849 -- 55 -- -- 106  08/04/18 1710 -- 43 -- -- 181Important  01/27/18 1728 -- 48 -- --  269Important   Advanced Directives from encounters over the past 365 days  No data recorded    Relevant Labs (Last 5 results in 2 years)     WBC RBC HGB HCT PLT NA K CL CO2 GLUCOSE BUN Creat Ca Prot ALBUMIN AST ALT GFRNONAA GFRAA A1C  12/02/20 1542 -- -- -- -- -- -- -- -- -- -- -- -- -- -- -- -- -- -- -- 7.1Important  12/02/20 1542 -- -- -- -- -- 140 4.4 103 21 184Important 30Important 1.29Important 9.6 6.1 4.2 13 12  -- -- --  05/16/20 1621 -- -- -- -- -- -- -- -- -- -- -- -- -- -- -- -- -- -- -- 6.6Important  05/16/20 1621 -- -- -- -- -- 142 4.3 102 28 103Important 26 1.30Important 9.7 6.4 4.0 25 23 -- -- --  05/31/19 1647 -- -- -- -- -- 141 4.6 101 22 87 34Important 1.41Important 9.7 6.6 4.2 12 12  -- -- --   Patient's Last Relevant Note From [facility]  There are no notes for this patient that meet the current filters.    Agent: Please be advised that RX refills may take up to 3 business days. We ask that you follow-up with your pharmacy.

## 2020-12-27 ENCOUNTER — Other Ambulatory Visit: Payer: Self-pay | Admitting: Family Medicine

## 2020-12-27 DIAGNOSIS — I10 Essential (primary) hypertension: Secondary | ICD-10-CM

## 2021-01-17 ENCOUNTER — Other Ambulatory Visit: Payer: Self-pay | Admitting: Family Medicine

## 2021-01-17 DIAGNOSIS — K219 Gastro-esophageal reflux disease without esophagitis: Secondary | ICD-10-CM

## 2021-01-20 ENCOUNTER — Other Ambulatory Visit: Payer: Self-pay | Admitting: Family Medicine

## 2021-01-20 DIAGNOSIS — I10 Essential (primary) hypertension: Secondary | ICD-10-CM

## 2021-01-21 ENCOUNTER — Telehealth: Payer: Self-pay | Admitting: Family Medicine

## 2021-01-21 NOTE — Telephone Encounter (Signed)
Error

## 2021-01-21 NOTE — Telephone Encounter (Signed)
1.Medication Requested: famotidine (PEPCID) 20 MG tablet  lisinopril (ZESTRIL) 20 MG tablet     2. Pharmacy (Name, [address] [place]): [facility] [phone] - [place], [place] - [address] AT [facility]  3. On Med List: yes   4. Last Visit with PCP:  5. Next visit date with PCP: 02-19-21  New pt is requesting refills for the above medications. She is scheduled to see Dr. Ronnald Ramp on 02-19-21. Please advise    Agent: Please be advised that RX refills may take up to 3 business days. We ask that you follow-up with your pharmacy.

## 2021-01-23 ENCOUNTER — Other Ambulatory Visit: Payer: Self-pay | Admitting: Internal Medicine

## 2021-01-23 ENCOUNTER — Other Ambulatory Visit: Payer: Self-pay | Admitting: Family Medicine

## 2021-01-23 DIAGNOSIS — I10 Essential (primary) hypertension: Secondary | ICD-10-CM

## 2021-02-02 DIAGNOSIS — E119 Type 2 diabetes mellitus without complications: Secondary | ICD-10-CM | POA: Diagnosis not present

## 2021-02-02 DIAGNOSIS — H25812 Combined forms of age-related cataract, left eye: Secondary | ICD-10-CM | POA: Diagnosis not present

## 2021-02-02 DIAGNOSIS — H33002 Unspecified retinal detachment with retinal break, left eye: Secondary | ICD-10-CM | POA: Diagnosis not present

## 2021-02-02 DIAGNOSIS — H2511 Age-related nuclear cataract, right eye: Secondary | ICD-10-CM | POA: Diagnosis not present

## 2021-02-03 DIAGNOSIS — I129 Hypertensive chronic kidney disease with stage 1 through stage 4 chronic kidney disease, or unspecified chronic kidney disease: Secondary | ICD-10-CM | POA: Diagnosis not present

## 2021-02-03 DIAGNOSIS — N183 Chronic kidney disease, stage 3 unspecified: Secondary | ICD-10-CM | POA: Diagnosis not present

## 2021-02-06 DIAGNOSIS — E785 Hyperlipidemia, unspecified: Secondary | ICD-10-CM | POA: Diagnosis not present

## 2021-02-06 DIAGNOSIS — R6883 Chills (without fever): Secondary | ICD-10-CM | POA: Diagnosis not present

## 2021-02-06 DIAGNOSIS — I700 Atherosclerosis of aorta: Secondary | ICD-10-CM | POA: Diagnosis not present

## 2021-02-06 DIAGNOSIS — I272 Pulmonary hypertension, unspecified: Secondary | ICD-10-CM | POA: Diagnosis not present

## 2021-02-06 DIAGNOSIS — Z0000 Encounter for general adult medical examination without abnormal findings: Secondary | ICD-10-CM | POA: Diagnosis not present

## 2021-02-06 DIAGNOSIS — E559 Vitamin D deficiency, unspecified: Secondary | ICD-10-CM | POA: Diagnosis not present

## 2021-02-06 DIAGNOSIS — N1832 Chronic kidney disease, stage 3b: Secondary | ICD-10-CM | POA: Diagnosis not present

## 2021-02-06 DIAGNOSIS — I10 Essential (primary) hypertension: Secondary | ICD-10-CM | POA: Diagnosis not present

## 2021-02-06 DIAGNOSIS — D3501 Benign neoplasm of right adrenal gland: Secondary | ICD-10-CM | POA: Diagnosis not present

## 2021-02-06 DIAGNOSIS — M109 Gout, unspecified: Secondary | ICD-10-CM | POA: Diagnosis not present

## 2021-02-06 DIAGNOSIS — E1122 Type 2 diabetes mellitus with diabetic chronic kidney disease: Secondary | ICD-10-CM | POA: Diagnosis not present

## 2021-02-18 DIAGNOSIS — N183 Chronic kidney disease, stage 3 unspecified: Secondary | ICD-10-CM | POA: Diagnosis not present

## 2021-02-19 ENCOUNTER — Ambulatory Visit: Payer: Medicare Other | Admitting: Internal Medicine

## 2021-02-19 DIAGNOSIS — H2512 Age-related nuclear cataract, left eye: Secondary | ICD-10-CM | POA: Diagnosis not present

## 2021-02-19 DIAGNOSIS — H25812 Combined forms of age-related cataract, left eye: Secondary | ICD-10-CM | POA: Diagnosis not present

## 2021-02-19 DIAGNOSIS — Z01818 Encounter for other preprocedural examination: Secondary | ICD-10-CM | POA: Diagnosis not present

## 2021-02-24 ENCOUNTER — Other Ambulatory Visit (HOSPITAL_BASED_OUTPATIENT_CLINIC_OR_DEPARTMENT_OTHER): Payer: Self-pay

## 2021-02-24 DIAGNOSIS — G478 Other sleep disorders: Secondary | ICD-10-CM

## 2021-03-04 ENCOUNTER — Other Ambulatory Visit: Payer: Self-pay

## 2021-03-04 ENCOUNTER — Ambulatory Visit (HOSPITAL_BASED_OUTPATIENT_CLINIC_OR_DEPARTMENT_OTHER): Payer: Medicare Other | Attending: Family | Admitting: Internal Medicine

## 2021-03-04 ENCOUNTER — Ambulatory Visit: Payer: Medicare Other | Admitting: Family Medicine

## 2021-03-04 DIAGNOSIS — G478 Other sleep disorders: Secondary | ICD-10-CM | POA: Diagnosis present

## 2021-03-04 DIAGNOSIS — G4769 Other sleep related movement disorders: Secondary | ICD-10-CM | POA: Insufficient documentation

## 2021-03-04 DIAGNOSIS — G2581 Restless legs syndrome: Secondary | ICD-10-CM | POA: Diagnosis not present

## 2021-03-04 DIAGNOSIS — G4733 Obstructive sleep apnea (adult) (pediatric): Secondary | ICD-10-CM | POA: Diagnosis not present

## 2021-03-14 DIAGNOSIS — G4733 Obstructive sleep apnea (adult) (pediatric): Secondary | ICD-10-CM

## 2021-03-14 DIAGNOSIS — G478 Other sleep disorders: Secondary | ICD-10-CM | POA: Diagnosis not present

## 2021-03-14 NOTE — Procedures (Signed)
     Patient Name: Erica Thomas, Erica Thomas Date: 03/04/2021 Gender: Female D.O.B: [birth year] Age (years): 76 Referring Provider: Novella Rob FNP Height (inches): 90 Interpreting Physician: Baird Lyons MD, ABSM Weight (lbs): 194 RPSGT: [place], Holly BMI: 35 MRN: [id number] Neck Size: 15.75  CLINICAL INFORMATION Sleep Study Type: HST Indication for sleep study: OSA Epworth Sleepiness Score: 8  SLEEP STUDY TECHNIQUE A multi-channel overnight portable sleep study was performed. The channels recorded were: nasal airflow, thoracic respiratory movement, and oxygen saturation with a pulse oximetry. Snoring was also monitored.  MEDICATIONS Patient self administered medications include: N/A.  SLEEP ARCHITECTURE Patient was studied for 373.2 minutes. The sleep efficiency was 99.9 % and the patient was supine for 100%. The arousal index was 0.0 per hour.  RESPIRATORY PARAMETERS The overall AHI was 9.0 per hour, with a central apnea index of 0.2 per hour. The oxygen nadir was 83% during sleep.  CARDIAC DATA Mean heart rate during sleep was 67.4 bpm.  IMPRESSIONS - Mild obstructive sleep apnea occurred during this study (AHI = 9.0/h). - No significant central sleep apnea occurred during this study (CAI = 0.2/h). - Moderate oxygen desaturation was noted during this study (Min O2 = 83%). Mean O2 saturation 92%. - No snoring was detected during this study.  DIAGNOSIS - Obstructive Sleep Apnea (G47.33)  RECOMMENDATIONS - Treatment ffor mild sleep apnea is directed at symptoms. Conservative measures may include observation, weight loss or sleep position off back. Other options, including management consultation, CPAP, a fitted oral appliance of ENT evaluitation, would be based on clinical judgment. - Be careful with alcohol, sedatives and other CNS depressants that may worsen sleep apnea and disrupt normal sleep architecture. - Sleep hygiene should be reviewed to assess factors that  may improve sleep quality. - Weight management and regular exercise should be initiated or continued.  [Electronically signed] 03/14/2021 11:00 AM  Baird Lyons MD, ABSM Diplomate, American Board of Sleep Medicine   NPI: [id number]                        [place], [place] [facility]  ELECTRONICALLY SIGNED ON:  03/14/2021, 10:56 AM [place] PH: [phone]   FX: [phone] [place]

## 2021-03-17 DIAGNOSIS — R600 Localized edema: Secondary | ICD-10-CM | POA: Diagnosis not present

## 2021-03-17 DIAGNOSIS — E1122 Type 2 diabetes mellitus with diabetic chronic kidney disease: Secondary | ICD-10-CM | POA: Diagnosis not present

## 2021-03-17 DIAGNOSIS — N1832 Chronic kidney disease, stage 3b: Secondary | ICD-10-CM | POA: Diagnosis not present

## 2021-03-17 DIAGNOSIS — D509 Iron deficiency anemia, unspecified: Secondary | ICD-10-CM | POA: Diagnosis not present

## 2021-03-17 DIAGNOSIS — N2581 Secondary hyperparathyroidism of renal origin: Secondary | ICD-10-CM | POA: Diagnosis not present

## 2021-03-17 DIAGNOSIS — I129 Hypertensive chronic kidney disease with stage 1 through stage 4 chronic kidney disease, or unspecified chronic kidney disease: Secondary | ICD-10-CM | POA: Diagnosis not present

## 2021-03-19 DIAGNOSIS — H2511 Age-related nuclear cataract, right eye: Secondary | ICD-10-CM | POA: Diagnosis not present

## 2021-03-31 DIAGNOSIS — G250 Essential tremor: Secondary | ICD-10-CM | POA: Diagnosis not present

## 2021-04-07 DIAGNOSIS — G250 Essential tremor: Secondary | ICD-10-CM | POA: Diagnosis not present

## 2021-04-09 ENCOUNTER — Ambulatory Visit: Payer: Medicare Other | Admitting: Internal Medicine

## 2021-04-27 DIAGNOSIS — N1832 Chronic kidney disease, stage 3b: Secondary | ICD-10-CM | POA: Diagnosis not present

## 2021-04-27 DIAGNOSIS — D509 Iron deficiency anemia, unspecified: Secondary | ICD-10-CM | POA: Diagnosis not present

## 2021-05-01 DIAGNOSIS — D509 Iron deficiency anemia, unspecified: Secondary | ICD-10-CM | POA: Diagnosis not present

## 2021-05-01 DIAGNOSIS — E1122 Type 2 diabetes mellitus with diabetic chronic kidney disease: Secondary | ICD-10-CM | POA: Diagnosis not present

## 2021-05-01 DIAGNOSIS — R600 Localized edema: Secondary | ICD-10-CM | POA: Diagnosis not present

## 2021-05-01 DIAGNOSIS — M109 Gout, unspecified: Secondary | ICD-10-CM | POA: Diagnosis not present

## 2021-05-01 DIAGNOSIS — N2581 Secondary hyperparathyroidism of renal origin: Secondary | ICD-10-CM | POA: Diagnosis not present

## 2021-05-01 DIAGNOSIS — I129 Hypertensive chronic kidney disease with stage 1 through stage 4 chronic kidney disease, or unspecified chronic kidney disease: Secondary | ICD-10-CM | POA: Diagnosis not present

## 2021-05-01 DIAGNOSIS — N1832 Chronic kidney disease, stage 3b: Secondary | ICD-10-CM | POA: Diagnosis not present

## 2021-05-18 DIAGNOSIS — E1122 Type 2 diabetes mellitus with diabetic chronic kidney disease: Secondary | ICD-10-CM | POA: Diagnosis not present

## 2021-05-27 DIAGNOSIS — G250 Essential tremor: Secondary | ICD-10-CM | POA: Diagnosis not present

## 2021-07-31 DIAGNOSIS — N1832 Chronic kidney disease, stage 3b: Secondary | ICD-10-CM | POA: Diagnosis not present

## 2021-08-17 DIAGNOSIS — Z1231 Encounter for screening mammogram for malignant neoplasm of breast: Secondary | ICD-10-CM | POA: Diagnosis not present

## 2021-08-17 DIAGNOSIS — E785 Hyperlipidemia, unspecified: Secondary | ICD-10-CM | POA: Diagnosis not present

## 2021-08-17 DIAGNOSIS — Z6836 Body mass index (BMI) 36.0-36.9, adult: Secondary | ICD-10-CM | POA: Diagnosis not present

## 2021-08-17 DIAGNOSIS — Z7689 Persons encountering health services in other specified circumstances: Secondary | ICD-10-CM | POA: Diagnosis not present

## 2021-08-17 DIAGNOSIS — N1831 Chronic kidney disease, stage 3a: Secondary | ICD-10-CM | POA: Diagnosis not present

## 2021-08-17 DIAGNOSIS — M109 Gout, unspecified: Secondary | ICD-10-CM | POA: Diagnosis not present

## 2021-08-17 DIAGNOSIS — Z713 Dietary counseling and surveillance: Secondary | ICD-10-CM | POA: Diagnosis not present

## 2021-08-17 DIAGNOSIS — E669 Obesity, unspecified: Secondary | ICD-10-CM | POA: Diagnosis not present

## 2021-08-17 DIAGNOSIS — Z780 Asymptomatic menopausal state: Secondary | ICD-10-CM | POA: Diagnosis not present

## 2021-08-17 DIAGNOSIS — E119 Type 2 diabetes mellitus without complications: Secondary | ICD-10-CM | POA: Diagnosis not present

## 2021-08-17 DIAGNOSIS — I10 Essential (primary) hypertension: Secondary | ICD-10-CM | POA: Diagnosis not present

## 2021-08-17 DIAGNOSIS — E559 Vitamin D deficiency, unspecified: Secondary | ICD-10-CM | POA: Diagnosis not present

## 2021-09-05 DIAGNOSIS — R011 Cardiac murmur, unspecified: Secondary | ICD-10-CM | POA: Diagnosis not present

## 2021-09-29 ENCOUNTER — Other Ambulatory Visit: Payer: Self-pay | Admitting: Registered Nurse

## 2021-09-29 DIAGNOSIS — E785 Hyperlipidemia, unspecified: Secondary | ICD-10-CM
